# Patient Record
Sex: Female | Born: 1995 | Race: Black or African American | Hispanic: No | Marital: Single | Smoking: Former smoker
Health system: Southern US, Community
[De-identification: ages and names within clinical notes are randomized; demographics above are authoritative.]

## PROBLEM LIST (undated history)

## (undated) DIAGNOSIS — J45909 Unspecified asthma, uncomplicated: Secondary | ICD-10-CM

---
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
## 2016-03-16 ENCOUNTER — Emergency Department: Payer: Medicaid Other

## 2016-03-16 ENCOUNTER — Encounter: Payer: Self-pay | Admitting: Urgent Care

## 2016-03-16 DIAGNOSIS — R0789 Other chest pain: Secondary | ICD-10-CM | POA: Insufficient documentation

## 2016-03-16 DIAGNOSIS — J45909 Unspecified asthma, uncomplicated: Secondary | ICD-10-CM | POA: Insufficient documentation

## 2016-03-16 DIAGNOSIS — F17200 Nicotine dependence, unspecified, uncomplicated: Secondary | ICD-10-CM | POA: Insufficient documentation

## 2016-03-16 DIAGNOSIS — R0602 Shortness of breath: Secondary | ICD-10-CM | POA: Diagnosis present

## 2016-03-16 LAB — CBC
HCT: 33.1 % — ABNORMAL LOW (ref 35.0–47.0)
Hemoglobin: 10.3 g/dL — ABNORMAL LOW (ref 12.0–16.0)
MCH: 21.9 pg — ABNORMAL LOW (ref 26.0–34.0)
MCHC: 31.2 g/dL — ABNORMAL LOW (ref 32.0–36.0)
MCV: 70.1 fL — ABNORMAL LOW (ref 80.0–100.0)
Platelets: 408 10*3/uL (ref 150–440)
RBC: 4.73 MIL/uL (ref 3.80–5.20)
RDW: 19.4 % — ABNORMAL HIGH (ref 11.5–14.5)
WBC: 5.8 10*3/uL (ref 3.6–11.0)

## 2016-03-16 LAB — BASIC METABOLIC PANEL
Anion gap: 11 (ref 5–15)
BUN: 11 mg/dL (ref 6–20)
CO2: 22 mmol/L (ref 22–32)
Calcium: 9.6 mg/dL (ref 8.9–10.3)
Chloride: 106 mmol/L (ref 101–111)
Creatinine, Ser: 0.9 mg/dL (ref 0.44–1.00)
GFR calc Af Amer: >60 mL/min (ref >60)
GFR calc non Af Amer: >60 mL/min (ref >60)
Glucose, Bld: 90 mg/dL (ref 65–99)
Potassium: 3.5 mmol/L (ref 3.5–5.1)
Sodium: 139 mmol/L (ref 135–145)

## 2016-03-16 LAB — TROPONIN I: Troponin I: <0.03 ng/mL (ref <0.03)

## 2016-03-16 NOTE — ED Triage Notes (Signed)
Patient presents with c/o retrosternal chest pain and SOB for months; worse tonight x 30-45 minutes. Patient breathing halted breathing pattern. Reports that symptoms worsened while smoking tonight. States, "I normally just brush this off, but tonight it wont go away."

## 2016-03-17 ENCOUNTER — Emergency Department
Admission: EM | Admit: 2016-03-17 | Discharge: 2016-03-17 | Disposition: A | Discharge status: Home / Self Care | Payer: Medicaid Other | Attending: Emergency Medicine | Admitting: Emergency Medicine

## 2016-03-17 DIAGNOSIS — R079 Chest pain, unspecified: Secondary | ICD-10-CM

## 2016-03-17 HISTORY — DX: Unspecified asthma, uncomplicated: J45.909

## 2016-03-17 LAB — TROPONIN I

## 2016-03-17 LAB — FIBRIN DERIVATIVES D-DIMER (ARMC ONLY): FIBRIN DERIVATIVES D-DIMER (ARMC): 149 (ref 0–499)

## 2016-03-17 MED ORDER — HYDROCODONE-ACETAMINOPHEN 5-325 MG PO TABS
1.0000 | ORAL_TABLET | Freq: Once | ORAL | Status: AC
  Administered 2016-03-17: 1 via ORAL
  Filled 2016-03-17: qty 1

## 2016-03-17 MED ORDER — IBUPROFEN 600 MG PO TABS
600.0000 mg | ORAL_TABLET | Freq: Once | ORAL | Status: AC
  Administered 2016-03-17: 600 mg via ORAL
  Filled 2016-03-17: qty 1

## 2016-03-17 MED ORDER — HYDROCODONE-ACETAMINOPHEN 5-325 MG PO TABS: 1.0000 | ORAL_TABLET | Freq: Four times a day (QID) | ORAL | 0 refills | Status: DC | PRN

## 2016-03-17 MED ORDER — IBUPROFEN 600 MG PO TABS: 600.0000 mg | ORAL_TABLET | Freq: Three times a day (TID) | ORAL | 0 refills | Status: DC | PRN

## 2016-03-17 NOTE — ED Notes (Signed)
MD at bedside. 

## 2016-03-17 NOTE — ED Notes (Signed)
Discharge instructions reviewed with patient. Questions fielded by this RN. Patient verbalizes understanding of instructions. Patient discharged home in stable condition per Sung MD . No acute distress noted at time of discharge.   

## 2016-03-17 NOTE — ED Provider Notes (Signed)
[facility] Emergency Department Provider Note   ____________________________________________   First MD Initiated Contact with Patient 03/17/16 0235     (approximate)  I have reviewed the triage vital signs and the nursing notes.   HISTORY  Chief Complaint Chest Pain and Shortness of Breath    HPI Paula Terrell is a 20 y.o. female who presents to the ED from home with a chief complaint of chest pain. Patient reports a several month history of chest pain which she "normally just brushes off". Last evening while she was smoking, patient experienced midsternal chest pain associated with shortness of breath. Symptoms were not associated with diaphoresis, nausea, vomiting or palpitations. Patient denies recent fever, chills, cough, congestion, abdominal pain, diarrhea. Denies recent travel or trauma. Used to be on birth control but no longer takes. Nothing makes her symptoms better. Movement and deep breathing make her symptoms worse.   Past Medical History:  Diagnosis Date  . Asthma    exercise induced    There are no active problems to display for this patient.   History reviewed. No pertinent surgical history.  Prior to Admission medications   Medication Sig Start Date End Date Taking? Authorizing Provider  HYDROcodone-acetaminophen (NORCO) 5-325 MG tablet Take 1 tablet by mouth every 6 (six) hours as needed for moderate pain. 03/17/16   Irean Hong, MD  ibuprofen (ADVIL,MOTRIN) 600 MG tablet Take 1 tablet (600 mg total) by mouth every 8 (eight) hours as needed. 03/17/16   Irean Hong, MD    Allergies Review of patient's allergies indicates no known allergies.  Family history None for CAD  Social History Social History  Substance Use Topics  . Smoking status: Current Every Day Smoker  . Smokeless tobacco: Never Used  . Alcohol use No    Review of Systems  Constitutional: No fever/chills. Eyes: No visual changes. ENT: No sore  throat. Cardiovascular: Positive for chest pain. Respiratory: Positive for shortness of breath. Gastrointestinal: No abdominal pain.  No nausea, no vomiting.  No diarrhea.  No constipation. Genitourinary: Negative for dysuria. Musculoskeletal: Negative for back pain. Skin: Negative for rash. Neurological: Negative for headaches, focal weakness or numbness.  10-point ROS otherwise negative.  ____________________________________________   PHYSICAL EXAM:  VITAL SIGNS: ED Triage Vitals [03/16/16 2155]  Enc Vitals Group     BP 136/66     Pulse Rate 90     Resp 20     Temp 98.6 F (37 C)     Temp Source Oral     SpO2 99 %     Weight 160 lb (72.6 kg)     Height  (1.676 m)     Head Circumference      Peak Flow      Pain Score 7     Pain Loc      Pain Edu?      Excl. in GC?     Constitutional: Alert and oriented. Well appearing and in no acute distress. Eyes: Conjunctivae are normal. PERRL. EOMI. Head: Atraumatic. Nose: No congestion/rhinnorhea. Mouth/Throat: Mucous membranes are moist.  Oropharynx non-erythematous. Neck: No stridor.   Cardiovascular: Normal rate, regular rhythm. Grossly normal heart sounds.  Good peripheral circulation. Respiratory: Normal respiratory effort.  No retractions. Lungs CTAB. Anterior chest wall tender to palpation.  Gastrointestinal: Soft and nontender. No distention. No abdominal bruits. No CVA tenderness. Musculoskeletal: No lower extremity tenderness nor edema.  No joint effusions. Neurologic:  Normal speech and language. No gross focal neurologic  deficits are appreciated. No gait instability. Skin:  Skin is warm, dry and intact. No rash noted. Psychiatric: Mood and affect are normal. Speech and behavior are normal.  ____________________________________________   LABS (all labs ordered are listed, but only abnormal results are displayed)  Labs Reviewed  CBC - Abnormal; Notable for the following:       Result Value   Hemoglobin  10.3 (*)    HCT 33.1 (*)    MCV 70.1 (*)    MCH 21.9 (*)    MCHC 31.2 (*)    RDW 19.4 (*)    All other components within normal limits  BASIC METABOLIC PANEL  TROPONIN I  TROPONIN I  FIBRIN DERIVATIVES D-DIMER ([facility] ONLY)   ____________________________________________  EKG  ED ECG REPORT I, Donnetta Gillin J, the attending physician, personally viewed and interpreted this ECG.   Date: 03/17/2016  EKG Time: 2202  Rate: 90  Rhythm: normal EKG, normal sinus rhythm  Axis: Normal  Intervals:none  ST&T Change: Nonspecific  ____________________________________________  RADIOLOGY  Chest 2 view (viewed by me, interpreted per Dr. Karle Starch): No active cardiopulmonary disease. ____________________________________________   PROCEDURES  Procedure(s) performed: None  Procedures  Critical Care performed: No  ____________________________________________   INITIAL IMPRESSION / ASSESSMENT AND PLAN / ED COURSE  Pertinent labs & imaging results that were available during my care of the patient were reviewed by me and considered in my medical decision making (see chart for details).  20 year old female who presents with a several month history of chest pain associated with shortness of breath which was more pronounced last evening. Chest pain has almost subsided without intervention. Will repeat troponin, check d-dimer and reassess.  Clinical Course  Comment By Time  Updated patient on negative d-dimer and repeat troponin results. She is eager for discharge and voices no complaints of pain. Strict return precautions given. Patient verbalizes understanding and agrees with plan of care. Irean Hong, MD 07/28 [phone]     ____________________________________________   FINAL CLINICAL IMPRESSION(S) / ED DIAGNOSES  Final diagnoses:  Chest pain, unspecified chest pain type      NEW MEDICATIONS STARTED DURING THIS VISIT:  New Prescriptions   HYDROCODONE-ACETAMINOPHEN (NORCO) 5-325 MG  TABLET    Take 1 tablet by mouth every 6 (six) hours as needed for moderate pain.   IBUPROFEN (ADVIL,MOTRIN) 600 MG TABLET    Take 1 tablet (600 mg total) by mouth every 8 (eight) hours as needed.     Note:  This document was prepared using Dragon voice recognition software and may include unintentional dictation errors.    Irean Hong, MD 03/17/16 [phone]

## 2016-03-17 NOTE — Discharge Instructions (Signed)
1. You may take pain medicines as needed (Motrin/Norco #15). 2. Apply moist heat to chest wall several times daily. 3. Return to the ER for worsening symptoms, persistent vomiting, difficult to breathing or other concerns.

## 2016-03-26 ENCOUNTER — Encounter: Payer: Self-pay | Admitting: Medical Oncology

## 2016-03-26 ENCOUNTER — Emergency Department
Admission: EM | Admit: 2016-03-26 | Discharge: 2016-03-26 | Disposition: A | Discharge status: Home / Self Care | Payer: Medicaid Other | Attending: Emergency Medicine | Admitting: Emergency Medicine

## 2016-03-26 DIAGNOSIS — J45909 Unspecified asthma, uncomplicated: Secondary | ICD-10-CM | POA: Diagnosis not present

## 2016-03-26 DIAGNOSIS — F17200 Nicotine dependence, unspecified, uncomplicated: Secondary | ICD-10-CM | POA: Insufficient documentation

## 2016-03-26 DIAGNOSIS — J069 Acute upper respiratory infection, unspecified: Secondary | ICD-10-CM | POA: Insufficient documentation

## 2016-03-26 DIAGNOSIS — R0981 Nasal congestion: Secondary | ICD-10-CM | POA: Diagnosis present

## 2016-03-26 MED ORDER — BENZONATATE 100 MG PO CAPS: 100.0000 mg | ORAL_CAPSULE | Freq: Three times a day (TID) | ORAL | 0 refills | Status: DC | PRN

## 2016-03-26 MED ORDER — FEXOFENADINE-PSEUDOEPHED ER 60-120 MG PO TB12: 1.0000 | ORAL_TABLET | Freq: Two times a day (BID) | ORAL | 0 refills | Status: DC

## 2016-03-26 NOTE — ED Triage Notes (Signed)
Pt began yesterday having nasal congestion and cough.

## 2016-03-26 NOTE — ED Provider Notes (Signed)
[facility] Emergency Department Provider Note   ____________________________________________   First MD Initiated Contact with Patient 03/26/16 [phone]     (approximate)  I have reviewed the triage vital signs and the nursing notes.   HISTORY  Chief Complaint Nasal Congestion    HPI Paula Terrell is a 20 y.o. female patient complaining is a congestion cough which started yesterday. Patient denies any fever associated this complaint. Patient has some mild nausea but no vomiting or diarrhea. Patient also stated intermittent ear pressure but denies hearing loss or vertigo. Patient rates her pain discomfort as a 7/10. Patient described her pain discomfort as "pressure". No palliative measures taken for this complaint.   Past Medical History:  Diagnosis Date  . Asthma    exercise induced    There are no active problems to display for this patient.   History reviewed. No pertinent surgical history.  Prior to Admission medications   Medication Sig Start Date End Date Taking? Authorizing Provider  benzonatate (TESSALON PERLES) 100 MG capsule Take 1 capsule (100 mg total) by mouth 3 (three) times daily as needed for cough. 03/26/16   Joni Reining, PA-C  fexofenadine-pseudoephedrine (ALLEGRA-D) 60-120 MG 12 hr tablet Take 1 tablet by mouth 2 (two) times daily. 03/26/16   Joni Reining, PA-C  HYDROcodone-acetaminophen (NORCO) 5-325 MG tablet Take 1 tablet by mouth every 6 (six) hours as needed for moderate pain. 03/17/16   Irean Hong, MD  ibuprofen (ADVIL,MOTRIN) 600 MG tablet Take 1 tablet (600 mg total) by mouth every 8 (eight) hours as needed. 03/17/16   Irean Hong, MD    Allergies Review of patient's allergies indicates no known allergies.  No family history on file.  Social History Social History  Substance Use Topics  . Smoking status: Current Every Day Smoker  . Smokeless tobacco: Never Used  . Alcohol use No    Review of Systems Constitutional:  No fever/chills Eyes: No visual changes. ENT: No sore throat. Cardiovascular: Denies chest pain. Respiratory: Denies shortness of breath. Gastrointestinal: No abdominal pain.  No nausea, no vomiting.  No diarrhea.  No constipation. Genitourinary: Negative for dysuria. Musculoskeletal: Negative for back pain. Skin: Negative for rash. Neurological: Negative for headaches, focal weakness or numbness.    ____________________________________________   PHYSICAL EXAM:  VITAL SIGNS: ED Triage Vitals  Enc Vitals Group     BP 03/26/16 0917 125/86     Pulse Rate 03/26/16 0917 97     Resp 03/26/16 0917 16     Temp 03/26/16 0917 98 F (36.7 C)     Temp Source 03/26/16 0917 Oral     SpO2 03/26/16 0917 98 %     Weight 03/26/16 0912 160 lb (72.6 kg)     Height 03/26/16 0912  (1.676 m)     Head Circumference --      Peak Flow --      Pain Score 03/26/16 0913 7     Pain Loc --      Pain Edu? --      Excl. in GC? --     Constitutional: Alert and oriented. Well appearing and in no acute distress. Eyes: Conjunctivae are normal. PERRL. EOMI. Head: Atraumatic. Nose: Edematous nasal turbinates.. Mouth/Throat: Mucous membranes are moist.  Oropharynx non-erythematous. Postnasal drainage Neck: No stridor.  No cervical spine tenderness to palpation. Hematological/Lymphatic/Immunilogical: No cervical lymphadenopathy. Cardiovascular: Normal rate, regular rhythm. Grossly normal heart sounds.  Good peripheral circulation. Respiratory: Normal respiratory effort.  No retractions. Lungs CTAB. Gastrointestinal: Soft and nontender. No distention. No abdominal bruits. No CVA tenderness. Musculoskeletal: No lower extremity tenderness nor edema.  No joint effusions. Neurologic:  Normal speech and language. No gross focal neurologic deficits are appreciated. No gait instability. Skin:  Skin is warm, dry and intact. No rash noted. Psychiatric: Mood and affect are normal. Speech and behavior are  normal.  ____________________________________________   LABS (all labs ordered are listed, but only abnormal results are displayed)  Labs Reviewed - No data to display ____________________________________________  EKG   ____________________________________________  RADIOLOGY   ____________________________________________   PROCEDURES  Procedure(s) performed: None  Procedures  Critical Care performed: No  ____________________________________________   INITIAL IMPRESSION / ASSESSMENT AND PLAN / ED COURSE  Pertinent labs & imaging results that were available during my care of the patient were reviewed by me and considered in my medical decision making (see chart for details).  URI. Patient given discharge Instructions. Patient given prescription for fexofenadine/pseudoephedrine and Tessalon Perles. Patient advised follow-up with "clinic if condition persists.  Clinical Course     ____________________________________________   FINAL CLINICAL IMPRESSION(S) / ED DIAGNOSES  Final diagnoses:  Upper respiratory infection      NEW MEDICATIONS STARTED DURING THIS VISIT:  New Prescriptions   BENZONATATE (TESSALON PERLES) 100 MG CAPSULE    Take 1 capsule (100 mg total) by mouth 3 (three) times daily as needed for cough.   FEXOFENADINE-PSEUDOEPHEDRINE (ALLEGRA-D) 60-120 MG 12 HR TABLET    Take 1 tablet by mouth 2 (two) times daily.     Note:  This document was prepared using Dragon voice recognition software and may include unintentional dictation errors.    Joni Reiningonald K Sharlize Hoar, PA-C 03/26/16 [id number]    Jene Everyobert Kinner, MD 03/26/16 1001

## 2016-03-26 NOTE — ED Notes (Signed)
States she developed some nasal congestion yesterday  This morning has had occasional cough  No fever/chills

## 2016-04-13 ENCOUNTER — Emergency Department
Admission: EM | Admit: 2016-04-13 | Discharge: 2016-04-13 | Disposition: A | Discharge status: Home / Self Care | Payer: Medicaid Other | Attending: Emergency Medicine | Admitting: Emergency Medicine

## 2016-04-13 ENCOUNTER — Encounter: Payer: Self-pay | Admitting: Emergency Medicine

## 2016-04-13 DIAGNOSIS — N72 Inflammatory disease of cervix uteri: Secondary | ICD-10-CM | POA: Diagnosis not present

## 2016-04-13 DIAGNOSIS — J45909 Unspecified asthma, uncomplicated: Secondary | ICD-10-CM | POA: Insufficient documentation

## 2016-04-13 DIAGNOSIS — Z791 Long term (current) use of non-steroidal anti-inflammatories (NSAID): Secondary | ICD-10-CM | POA: Insufficient documentation

## 2016-04-13 DIAGNOSIS — F17200 Nicotine dependence, unspecified, uncomplicated: Secondary | ICD-10-CM | POA: Insufficient documentation

## 2016-04-13 DIAGNOSIS — N898 Other specified noninflammatory disorders of vagina: Secondary | ICD-10-CM | POA: Diagnosis present

## 2016-04-13 LAB — URINALYSIS COMPLETE WITH MICROSCOPIC (ARMC ONLY)
Bilirubin Urine: NEGATIVE
GLUCOSE, UA: NEGATIVE mg/dL
HGB URINE DIPSTICK: NEGATIVE
Ketones, ur: NEGATIVE mg/dL
NITRITE: NEGATIVE
Protein, ur: NEGATIVE mg/dL
SPECIFIC GRAVITY, URINE: 1.019 (ref 1.005–1.030)
pH: 5 (ref 5.0–8.0)

## 2016-04-13 LAB — CHLAMYDIA/NGC RT PCR (ARMC ONLY)
Chlamydia Tr: DETECTED — AB
N gonorrhoeae: DETECTED — AB

## 2016-04-13 LAB — WET PREP, GENITAL
CLUE CELLS WET PREP: NONE SEEN
SPERM: NONE SEEN
TRICH WET PREP: NONE SEEN
Yeast Wet Prep HPF POC: NONE SEEN

## 2016-04-13 LAB — POCT PREGNANCY, URINE: PREG TEST UR: NEGATIVE

## 2016-04-13 MED ORDER — AZITHROMYCIN 500 MG PO TABS
1000.0000 mg | ORAL_TABLET | Freq: Every day | ORAL | Status: DC
  Administered 2016-04-13: 1000 mg via ORAL
  Filled 2016-04-13: qty 2

## 2016-04-13 MED ORDER — CEFTRIAXONE SODIUM 1 G IJ SOLR
500.0000 mg | Freq: Once | INTRAMUSCULAR | Status: AC
  Administered 2016-04-13: 500 mg via INTRAMUSCULAR
  Filled 2016-04-13: qty 10

## 2016-04-13 MED ORDER — DOXYCYCLINE HYCLATE 100 MG PO CAPS: 100.0000 mg | ORAL_CAPSULE | Freq: Two times a day (BID) | ORAL | 0 refills | Status: DC

## 2016-04-13 MED ORDER — LIDOCAINE HCL (PF) 1 % IJ SOLN
2.1000 mL | Freq: Once | INTRAMUSCULAR | Status: AC
  Administered 2016-04-13: 2.1 mL
  Filled 2016-04-13: qty 5

## 2016-04-13 NOTE — ED Triage Notes (Addendum)
Pt from home with vaginal discharge x 1 week. States she also has "bumps" in her pubic region. She denies fever, chills. Pt confirms unprotected sex. States she has some burning on urination.

## 2016-04-13 NOTE — ED Notes (Signed)
See triage note. States she noticed some vaginal discharge and foul odor for the past 2 weeks  Also some vaginal itching..Paula Terrell

## 2016-04-13 NOTE — ED Notes (Signed)
NAD noted at time of D/C. Pt denies questions or concerns. Pt ambulatory to the lobby at this time.  

## 2016-04-13 NOTE — ED Provider Notes (Signed)
[facility] Emergency Department Provider Note  ____________________________________________  Time seen: Approximately 1:09 PM  I have reviewed the triage vital signs and the nursing notes.   HISTORY  Chief Complaint Vaginal Discharge    HPI Zaryiah Barz is a 20 y.o. female who presents to the emergency department for evaluation of vaginal discharge, bumps in the pubic region, and dysuria. Symptoms of the present for the past week. She denies fever or chills. She reports having had unprotected sex.  Past Medical History:  Diagnosis Date  . Asthma    exercise induced    There are no active problems to display for this patient.   History reviewed. No pertinent surgical history.  Prior to Admission medications   Medication Sig Start Date End Date Taking? Authorizing Provider  benzonatate (TESSALON PERLES) 100 MG capsule Take 1 capsule (100 mg total) by mouth 3 (three) times daily as needed for cough. 03/26/16   Joni Reining, PA-C  doxycycline (VIBRAMYCIN) 100 MG capsule Take 1 capsule (100 mg total) by mouth 2 (two) times daily. 04/13/16   Aneita Kiger B Charlyn Vialpando, FNP  fexofenadine-pseudoephedrine (ALLEGRA-D) 60-120 MG 12 hr tablet Take 1 tablet by mouth 2 (two) times daily. 03/26/16   Joni Reining, PA-C  HYDROcodone-acetaminophen (NORCO) 5-325 MG tablet Take 1 tablet by mouth every 6 (six) hours as needed for moderate pain. 03/17/16   Irean Hong, MD  ibuprofen (ADVIL,MOTRIN) 600 MG tablet Take 1 tablet (600 mg total) by mouth every 8 (eight) hours as needed. 03/17/16   Irean Hong, MD    Allergies Review of patient's allergies indicates no known allergies.  History reviewed. No pertinent family history.  Social History Social History  Substance Use Topics  . Smoking status: Current Every Day Smoker  . Smokeless tobacco: Never Used  . Alcohol use No    Review of Systems Constitutional: Negative for fever. Respiratory: Negative for shortness of breath or  cough. Gastrointestinal: Negative for abdominal pain; negative for nausea , negative for vomiting. Genitourinary: Positive for dysuria , positive for vaginal discharge. Musculoskeletal: Negative for back pain. Skin: Reports bumps in the pelvic area. ____________________________________________   PHYSICAL EXAM:  VITAL SIGNS: ED Triage Vitals [04/13/16 1243]  Enc Vitals Group     BP 132/81     Pulse Rate 85     Resp      Temp 98.8 F (37.1 C)     Temp Source Oral     SpO2 99 %     Weight 170 lb (77.1 kg)     Height 5\' 6"  (1.676 m)     Head Circumference      Peak Flow      Pain Score      Pain Loc      Pain Edu?      Excl. in GC?     Constitutional: Alert and oriented. Well appearing and in no acute distress. Eyes: Conjunctivae are normal. PERRL. EOMI. Head: Atraumatic. Nose: No congestion/rhinnorhea. Mouth/Throat: Mucous membranes are moist. Respiratory: Normal respiratory effort.  No retractions. Gastrointestinal: Soft, nontender, no guarding, no rebound. Genitourinary: Pelvic exam: No exudate noted on the walls of the vaginal vault. Discharge from the cervix noted at the os. Mild cervical motion tenderness on exam. Adnexa normal on palpation. Musculoskeletal: No extremity tenderness nor edema.  Neurologic:  Normal speech and language. No gross focal neurologic deficits are appreciated. Speech is normal. No gait instability. Skin:  Skin is warm, dry and intact. No rash noted.  Psychiatric: Mood and affect are normal. Speech and behavior are normal.  ____________________________________________   LABS (all labs ordered are listed, but only abnormal results are displayed)  Labs Reviewed  CHLAMYDIA/NGC RT PCR ([facility] ONLY) - Abnormal; Notable for the following:       Result Value   Chlamydia Tr DETECTED (*)    N gonorrhoeae DETECTED (*)    All other components within normal limits  WET PREP, GENITAL - Abnormal; Notable for the following:    WBC, Wet Prep HPF  POC FEW (*)    All other components within normal limits  URINALYSIS COMPLETEWITH MICROSCOPIC ([facility] ONLY) - Abnormal; Notable for the following:    Color, Urine YELLOW (*)    APPearance CLEAR (*)    Leukocytes, UA 1+ (*)    Bacteria, UA RARE (*)    Squamous Epithelial / LPF 0-5 (*)    All other components within normal limits  POC URINE PREG, ED  POCT PREGNANCY, URINE   ____________________________________________  RADIOLOGY  Not indicated ____________________________________________   PROCEDURES  Procedure(s) performed: Pelvic exam  ____________________________________________   INITIAL IMPRESSION / ASSESSMENT AND PLAN / ED COURSE  Pertinent labs & imaging results that were available during my care of the patient were reviewed by me and considered in my medical decision making (see chart for details).  IM Rocephin and azithromycin 1 g by mouth given while in the emergency department.  Patient will be given prescriptions for doxycycline today. She was advised to follow up with [facility] for symptoms that are not improving over the week. She was advised to return to the ER for symptoms that change or worsen if unable to schedule an appointment. ____________________________________________   FINAL CLINICAL IMPRESSION(S) / ED DIAGNOSES  Final diagnoses:  Cervicitis    Note:  This document was prepared using Dragon voice recognition software and may include unintentional dictation errors.    Chinita PesterCari B Marijah Larranaga, FNP 04/14/16 1709    Phineas SemenGraydon Goodman, MD 04/17/16 [phone]

## 2016-04-14 ENCOUNTER — Telehealth: Payer: Self-pay | Admitting: Emergency Medicine

## 2016-04-14 NOTE — Telephone Encounter (Signed)
Called patient and informed her of positive chlamydia and gonorrhea tests.  She was treated in the ED.  She says her partner was here as well and was treated.

## 2016-06-12 ENCOUNTER — Encounter: Payer: Self-pay | Admitting: Emergency Medicine

## 2016-06-12 ENCOUNTER — Emergency Department
Admission: EM | Admit: 2016-06-12 | Discharge: 2016-06-13 | Disposition: A | Discharge status: Home / Self Care | Payer: Medicaid Other | Attending: Emergency Medicine | Admitting: Emergency Medicine

## 2016-06-12 DIAGNOSIS — J45909 Unspecified asthma, uncomplicated: Secondary | ICD-10-CM | POA: Insufficient documentation

## 2016-06-12 DIAGNOSIS — R0789 Other chest pain: Secondary | ICD-10-CM | POA: Diagnosis not present

## 2016-06-12 DIAGNOSIS — F17200 Nicotine dependence, unspecified, uncomplicated: Secondary | ICD-10-CM | POA: Insufficient documentation

## 2016-06-12 DIAGNOSIS — Z79899 Other long term (current) drug therapy: Secondary | ICD-10-CM | POA: Diagnosis not present

## 2016-06-12 DIAGNOSIS — R079 Chest pain, unspecified: Secondary | ICD-10-CM

## 2016-06-12 NOTE — ED Notes (Signed)
Pt states at 22:30 CP and SOB began. No SOB  Noted, pt talking in complete sentences. No increase in breathing noted. Pt states she was leaving work, smoking a cigarette when symptoms began. Denies any weakness, N&V, back pain. Pain is constant, does not radiate.

## 2016-06-12 NOTE — ED Triage Notes (Addendum)
Pt in with co chest pain since 2230 states shob but no shob noted at this time. Hx of the same and was dx with panic attacks. States pain worse when she takes a deep breath.

## 2016-06-13 ENCOUNTER — Emergency Department: Payer: Medicaid Other

## 2016-06-13 LAB — TROPONIN I

## 2016-06-13 LAB — CBC WITH DIFFERENTIAL/PLATELET
BASOS ABS: 0.1 10*3/uL (ref 0–0.1)
BASOS PCT: 1 %
EOS ABS: 0.1 10*3/uL (ref 0–0.7)
EOS PCT: 1 %
HCT: 31.5 % — ABNORMAL LOW (ref 35.0–47.0)
Hemoglobin: 10.2 g/dL — ABNORMAL LOW (ref 12.0–16.0)
LYMPHS PCT: 39 %
Lymphs Abs: 2.6 10*3/uL (ref 1.0–3.6)
MCH: 23.8 pg — ABNORMAL LOW (ref 26.0–34.0)
MCHC: 32.3 g/dL (ref 32.0–36.0)
MCV: 73.5 fL — AB (ref 80.0–100.0)
MONO ABS: 0.4 10*3/uL (ref 0.2–0.9)
Monocytes Relative: 6 %
Neutro Abs: 3.6 10*3/uL (ref 1.4–6.5)
Neutrophils Relative %: 53 %
PLATELETS: 274 10*3/uL (ref 150–440)
RBC: 4.29 MIL/uL (ref 3.80–5.20)
RDW: 18.3 % — AB (ref 11.5–14.5)
WBC: 6.7 10*3/uL (ref 3.6–11.0)

## 2016-06-13 LAB — COMPREHENSIVE METABOLIC PANEL
ALT: 12 U/L — AB (ref 14–54)
AST: 18 U/L (ref 15–41)
Albumin: 3.9 g/dL (ref 3.5–5.0)
Alkaline Phosphatase: 36 U/L — ABNORMAL LOW (ref 38–126)
Anion gap: 7 (ref 5–15)
BUN: 14 mg/dL (ref 6–20)
CHLORIDE: 106 mmol/L (ref 101–111)
CO2: 24 mmol/L (ref 22–32)
CREATININE: 0.82 mg/dL (ref 0.44–1.00)
Calcium: 9.2 mg/dL (ref 8.9–10.3)
GFR calc Af Amer: >60 mL/min (ref >60)
Glucose, Bld: 84 mg/dL (ref 65–99)
POTASSIUM: 3.6 mmol/L (ref 3.5–5.1)
SODIUM: 137 mmol/L (ref 135–145)
Total Bilirubin: 0.4 mg/dL (ref 0.3–1.2)
Total Protein: 7.9 g/dL (ref 6.5–8.1)

## 2016-06-13 MED ORDER — KETOROLAC TROMETHAMINE 30 MG/ML IJ SOLN
10.0000 mg | Freq: Once | INTRAMUSCULAR | Status: AC
  Administered 2016-06-13: 9.9 mg via INTRAVENOUS
  Filled 2016-06-13: qty 1

## 2016-06-13 NOTE — ED Provider Notes (Signed)
[facility] Emergency Department Provider Note   ____________________________________________   First MD Initiated Contact with Patient 06/13/16 0034     (approximate)  I have reviewed the triage vital signs and the nursing notes.   HISTORY  Chief Complaint Chest Pain    HPI Paula Terrell is a 20 y.o. female who presents to the ED from work with a chief complaint of chest pain. Reports leaving work approximately 10:30 PM and had onset of sternal and right-sided sharp chest pain. States initially she was short of breath but that has resolved. Reports history of same and diagnosed with panic attacks. Reports she was not anxious at the time of pain onset. She was however, smoking a cigarette when her symptoms began. Denies recent fever, chills, cough, congestion, abdominal pain, nausea, vomiting, diarrhea, dysuria, diaphoresis. Denies recent travel or trauma. Denies OCP use. Nothing makes her symptoms better. Deep inspiration makes her pain worse.   Past Medical History:  Diagnosis Date  . Asthma    exercise induced    There are no active problems to display for this patient.   No past surgical history on file.  Prior to Admission medications   Medication Sig Start Date End Date Taking? Authorizing Provider  benzonatate (TESSALON PERLES) 100 MG capsule Take 1 capsule (100 mg total) by mouth 3 (three) times daily as needed for cough. 03/26/16   Joni Reiningonald K Smith, PA-C  doxycycline (VIBRAMYCIN) 100 MG capsule Take 1 capsule (100 mg total) by mouth 2 (two) times daily. 04/13/16   Cari B Triplett, FNP  fexofenadine-pseudoephedrine (ALLEGRA-D) 60-120 MG 12 hr tablet Take 1 tablet by mouth 2 (two) times daily. 03/26/16   Joni Reiningonald K Smith, PA-C  HYDROcodone-acetaminophen (NORCO) 5-325 MG tablet Take 1 tablet by mouth every 6 (six) hours as needed for moderate pain. 03/17/16   Irean HongJade J Sung, MD  ibuprofen (ADVIL,MOTRIN) 600 MG tablet Take 1 tablet (600 mg total) by mouth every 8  (eight) hours as needed. 03/17/16   Irean HongJade J Sung, MD    Allergies Review of patient's allergies indicates no known allergies.  No family history on file.  Social History Social History  Substance Use Topics  . Smoking status: Current Every Day Smoker  . Smokeless tobacco: Never Used  . Alcohol use No    Review of Systems  Constitutional: No fever/chills. Eyes: No visual changes. ENT: No sore throat. Cardiovascular: Positive for chest pain. Respiratory: Denies shortness of breath. Gastrointestinal: No abdominal pain.  No nausea, no vomiting.  No diarrhea.  No constipation. Genitourinary: Negative for dysuria. Musculoskeletal: Negative for back pain. Skin: Negative for rash. Neurological: Negative for headaches, focal weakness or numbness.  10-point ROS otherwise negative.  ____________________________________________   PHYSICAL EXAM:  VITAL SIGNS: ED Triage Vitals  Enc Vitals Group     BP 06/12/16 2310 (!) 146/86     Pulse Rate 06/12/16 2310 95     Resp 06/12/16 2310 18     Temp 06/12/16 2310 98.6 F (37 C)     Temp Source 06/12/16 2310 Oral     SpO2 06/12/16 2310 99 %     Weight 06/12/16 2310 170 lb (77.1 kg)     Height 06/12/16 2310 5\' 6"  (1.676 m)     Head Circumference --      Peak Flow --      Pain Score 06/12/16 2311 8     Pain Loc --      Pain Edu? --  Excl. in GC? --     Constitutional: Alert and oriented. Well appearing and in no acute distress. Eyes: Conjunctivae are normal. PERRL. EOMI. Head: Atraumatic. Nose: No congestion/rhinnorhea. Mouth/Throat: Mucous membranes are moist.  Oropharynx non-erythematous. Neck: No stridor.   Cardiovascular: Normal rate, regular rhythm. Grossly normal heart sounds.  Good peripheral circulation. Respiratory: Normal respiratory effort.  No retractions. Lungs CTAB. Gastrointestinal: Soft and nontender. No distention. No abdominal bruits. No CVA tenderness. Musculoskeletal: No lower extremity tenderness nor  edema.  No joint effusions.  No calf tenderness. Neurologic:  Normal speech and language. No gross focal neurologic deficits are appreciated. No gait instability. Skin:  Skin is warm, dry and intact. No rash noted. Psychiatric: Mood and affect are normal. Speech and behavior are normal.  ____________________________________________   LABS (all labs ordered are listed, but only abnormal results are displayed)  Labs Reviewed  CBC WITH DIFFERENTIAL/PLATELET - Abnormal; Notable for the following:       Result Value   Hemoglobin 10.2 (*)    HCT 31.5 (*)    MCV 73.5 (*)    MCH 23.8 (*)    RDW 18.3 (*)    All other components within normal limits  COMPREHENSIVE METABOLIC PANEL - Abnormal; Notable for the following:    ALT 12 (*)    Alkaline Phosphatase 36 (*)    All other components within normal limits  TROPONIN I   ____________________________________________  EKG  ED ECG REPORT I, SUNG,JADE J, the attending physician, personally viewed and interpreted this ECG.   Date: 06/13/2016  EKG Time: 2315  Rate: 96  Rhythm: normal EKG, normal sinus rhythm  Axis: Normal  Intervals:none  ST&T Change: Nonspecific  ____________________________________________  RADIOLOGY  Chest 2 view (viewed by me, interpreted per Dr. Gwenyth Bender): No active cardiopulmonary disease. ____________________________________________   PROCEDURES  Procedure(s) performed: None  Procedures  Critical Care performed: No  ____________________________________________   INITIAL IMPRESSION / ASSESSMENT AND PLAN / ED COURSE  Pertinent labs & imaging results that were available during my care of the patient were reviewed by me and considered in my medical decision making (see chart for details).  20 year old female who presents with chest pain. Low suspicion for ACS, PE, aortic dissection. Will obtain screening lab work including troponin and chest x-ray.  Clinical Course  Comment By Time  Patient  resting in no acute distress. Voices no complaints of chest pain. Updated her of laboratory imaging results. Strict return precautions given. Patient verbalizes understanding and agrees with plan of care. Irean Hong, MD 10/24 0300     ____________________________________________   FINAL CLINICAL IMPRESSION(S) / ED DIAGNOSES  Final diagnoses:  Nonspecific chest pain      NEW MEDICATIONS STARTED DURING THIS VISIT:  New Prescriptions   No medications on file     Note:  This document was prepared using Dragon voice recognition software and may include unintentional dictation errors.    Irean Hong, MD 06/13/16 [phone]

## 2016-06-13 NOTE — Discharge Instructions (Signed)
1. You may take Tylenol and/or ibuprofen as needed for discomfort. 2. Return to the ER for worsening symptoms, persistent vomiting, difficulty breathing or other concerns.

## 2016-06-13 NOTE — ED Notes (Signed)
Pt and visitor asleep in room. Warm blanket covering pt. Lights dimmed for comfort.

## 2016-06-13 NOTE — ED Notes (Signed)
[facility]

## 2016-06-13 NOTE — ED Notes (Signed)
Discharge instructions reviewed with patient. Questions fielded by this RN. Patient verbalizes understanding of instructions. Patient discharged home in stable condition per Sung MD . No acute distress noted at time of discharge.   

## 2016-06-15 ENCOUNTER — Encounter: Payer: Self-pay | Admitting: *Deleted

## 2016-06-15 ENCOUNTER — Ambulatory Visit: Payer: Medicaid Other | Admitting: Cardiology

## 2016-06-30 ENCOUNTER — Emergency Department (HOSPITAL_COMMUNITY): Payer: MEDICAID

## 2016-06-30 ENCOUNTER — Encounter (HOSPITAL_COMMUNITY): Payer: Self-pay | Admitting: Emergency Medicine

## 2016-06-30 ENCOUNTER — Emergency Department (HOSPITAL_COMMUNITY)
Admission: EM | Admit: 2016-06-30 | Discharge: 2016-07-01 | Disposition: A | Discharge status: Other Acute Inpatient Hospital | Payer: MEDICAID | Attending: Emergency Medicine | Admitting: Emergency Medicine

## 2016-06-30 DIAGNOSIS — F329 Major depressive disorder, single episode, unspecified: Secondary | ICD-10-CM | POA: Diagnosis not present

## 2016-06-30 DIAGNOSIS — F17210 Nicotine dependence, cigarettes, uncomplicated: Secondary | ICD-10-CM | POA: Diagnosis not present

## 2016-06-30 DIAGNOSIS — F332 Major depressive disorder, recurrent severe without psychotic features: Secondary | ICD-10-CM | POA: Diagnosis not present

## 2016-06-30 DIAGNOSIS — F32A Depression, unspecified: Secondary | ICD-10-CM

## 2016-06-30 DIAGNOSIS — Z79899 Other long term (current) drug therapy: Secondary | ICD-10-CM | POA: Diagnosis not present

## 2016-06-30 DIAGNOSIS — R461 Bizarre personal appearance: Secondary | ICD-10-CM | POA: Diagnosis present

## 2016-06-30 DIAGNOSIS — F17200 Nicotine dependence, unspecified, uncomplicated: Secondary | ICD-10-CM | POA: Diagnosis not present

## 2016-06-30 DIAGNOSIS — R45851 Suicidal ideations: Secondary | ICD-10-CM | POA: Diagnosis not present

## 2016-06-30 DIAGNOSIS — J45909 Unspecified asthma, uncomplicated: Secondary | ICD-10-CM | POA: Diagnosis not present

## 2016-06-30 LAB — CBC
HEMATOCRIT: 31.3 % — AB (ref 36.0–46.0)
HEMOGLOBIN: 9.8 g/dL — AB (ref 12.0–15.0)
MCH: 23.3 pg — ABNORMAL LOW (ref 26.0–34.0)
MCHC: 31.3 g/dL (ref 30.0–36.0)
MCV: 74.3 fL — AB (ref 78.0–100.0)
Platelets: 363 10*3/uL (ref 150–400)
RBC: 4.21 MIL/uL (ref 3.87–5.11)
RDW: 16.4 % — ABNORMAL HIGH (ref 11.5–15.5)
WBC: 8 10*3/uL (ref 4.0–10.5)

## 2016-06-30 LAB — COMPREHENSIVE METABOLIC PANEL
ALBUMIN: 4.1 g/dL (ref 3.5–5.0)
ALT: 13 U/L — ABNORMAL LOW (ref 14–54)
AST: 23 U/L (ref 15–41)
Alkaline Phosphatase: 38 U/L (ref 38–126)
Anion gap: 7 (ref 5–15)
BILIRUBIN TOTAL: 0.6 mg/dL (ref 0.3–1.2)
BUN: 13 mg/dL (ref 6–20)
CHLORIDE: 107 mmol/L (ref 101–111)
CO2: 24 mmol/L (ref 22–32)
Calcium: 9.3 mg/dL (ref 8.9–10.3)
Creatinine, Ser: 0.88 mg/dL (ref 0.44–1.00)
GFR calc Af Amer: >60 mL/min (ref >60)
GFR calc non Af Amer: >60 mL/min (ref >60)
GLUCOSE: 92 mg/dL (ref 65–99)
POTASSIUM: 3.7 mmol/L (ref 3.5–5.1)
SODIUM: 138 mmol/L (ref 135–145)
Total Protein: 7.9 g/dL (ref 6.5–8.1)

## 2016-06-30 LAB — SALICYLATE LEVEL: Salicylate Lvl: <7 mg/dL (ref 2.8–30.0)

## 2016-06-30 LAB — I-STAT BETA HCG BLOOD, ED (MC, WL, AP ONLY): I-stat hCG, quantitative: <5 m[IU]/mL (ref <5)

## 2016-06-30 LAB — ETHANOL: Alcohol, Ethyl (B): <5 mg/dL (ref <5)

## 2016-06-30 LAB — ACETAMINOPHEN LEVEL: Acetaminophen (Tylenol), Serum: <10 ug/mL — ABNORMAL LOW (ref 10–30)

## 2016-06-30 MED ORDER — IBUPROFEN 200 MG PO TABS
600.0000 mg | ORAL_TABLET | Freq: Four times a day (QID) | ORAL | Status: DC | PRN
  Administered 2016-06-30: 600 mg via ORAL
  Filled 2016-06-30: qty 3

## 2016-06-30 MED ORDER — NICOTINE 21 MG/24HR TD PT24: 21.0000 mg | MEDICATED_PATCH | Freq: Every day | TRANSDERMAL | Status: DC

## 2016-06-30 MED ORDER — ONDANSETRON HCL 4 MG PO TABS: 4.0000 mg | ORAL_TABLET | Freq: Three times a day (TID) | ORAL | Status: DC | PRN

## 2016-06-30 MED ORDER — ALUM & MAG HYDROXIDE-SIMETH 200-200-20 MG/5ML PO SUSP: 30.0000 mL | ORAL | Status: DC | PRN

## 2016-06-30 NOTE — ED Notes (Signed)
Pt transported to x-ray for hand; security went with them.

## 2016-06-30 NOTE — ED Provider Notes (Signed)
[facility]-EMERGENCY DEPT Provider Note   CSN: [id number] Arrival date & time: 06/30/16  1135     History   Chief Complaint Chief Complaint  Patient presents with  . Medical Clearance    HPI Paula Terrell is a 20 y.o. female.  20 year old female who is here under IVC due to bizarre behavior as well as suicidal ideations. Patient denies this at this time. She denies any current alcohol or drug use. She is very invasive upon questioning. According to police patient was initially combative. Patient states that she injured her right wrist earlier but is unsure of how this happened. Upon further questioning she is becoming more agitated and refuses to give any further information. She does deny spine to internal stimuli. She denies being suicidal at this time      Past Medical History:  Diagnosis Date  . Asthma    exercise induced    There are no active problems to display for this patient.   History reviewed. No pertinent surgical history.  OB History    No data available       Home Medications    Prior to Admission medications   Medication Sig Start Date End Date Taking? Authorizing Provider  benzonatate (TESSALON PERLES) 100 MG capsule Take 1 capsule (100 mg total) by mouth 3 (three) times daily as needed for cough. 03/26/16   Joni Reiningonald K Smith, PA-C  doxycycline (VIBRAMYCIN) 100 MG capsule Take 1 capsule (100 mg total) by mouth 2 (two) times daily. 04/13/16   Cari B Triplett, FNP  fexofenadine-pseudoephedrine (ALLEGRA-D) 60-120 MG 12 hr tablet Take 1 tablet by mouth 2 (two) times daily. 03/26/16   Joni Reiningonald K Smith, PA-C  HYDROcodone-acetaminophen (NORCO) 5-325 MG tablet Take 1 tablet by mouth every 6 (six) hours as needed for moderate pain. 03/17/16   Irean HongJade J Sung, MD  ibuprofen (ADVIL,MOTRIN) 600 MG tablet Take 1 tablet (600 mg total) by mouth every 8 (eight) hours as needed. 03/17/16   Irean HongJade J Sung, MD    Family History No family history on file.  Social History Social History    Substance Use Topics  . Smoking status: Current Every Day Smoker  . Smokeless tobacco: Never Used  . Alcohol use No     Allergies   Patient has no known allergies.   Review of Systems Review of Systems  Unable to perform ROS: Psychiatric disorder     Physical Exam Updated Vital Signs BP 121/76   Pulse 100   Temp 98.2 F (36.8 C)   Resp 16   LMP 05/13/2016 (LMP Unknown)   SpO2 100%   Physical Exam  Constitutional: She is oriented to person, place, and time. She appears well-developed and well-nourished.  Non-toxic appearance. No distress.  HENT:  Head: Normocephalic and atraumatic.  Eyes: Conjunctivae, EOM and lids are normal. Pupils are equal, round, and reactive to light.  Neck: Normal range of motion. Neck supple. No tracheal deviation present. No thyroid mass present.  Cardiovascular: Normal rate, regular rhythm and normal heart sounds.  Exam reveals no gallop.   No murmur heard. Pulmonary/Chest: Effort normal and breath sounds normal. No stridor. No respiratory distress. She has no decreased breath sounds. She has no wheezes. She has no rhonchi. She has no rales.  Abdominal: Soft. Normal appearance and bowel sounds are normal. She exhibits no distension. There is no tenderness. There is no rebound and no CVA tenderness.  Musculoskeletal: Normal range of motion. She exhibits no edema or tenderness.  Arms: Right wrist without swelling or deformity. Full range of motion. Radial pulse 2+. Neurovascular intact distally  Neurological: She is alert and oriented to person, place, and time. She has normal strength. No cranial nerve deficit or sensory deficit. GCS eye subscore is 4. GCS verbal subscore is 5. GCS motor subscore is 6.  Skin: Skin is warm and dry. No abrasion and no rash noted.  Psychiatric: Her affect is blunt. Her speech is delayed. She is withdrawn. She is inattentive.  Nursing note and vitals reviewed.    ED Treatments / Results  Labs (all labs  ordered are listed, but only abnormal results are displayed) Labs Reviewed  CBC - Abnormal; Notable for the following:       Result Value   Hemoglobin 9.8 (*)    HCT 31.3 (*)    MCV 74.3 (*)    MCH 23.3 (*)    RDW 16.4 (*)    All other components within normal limits  COMPREHENSIVE METABOLIC PANEL  ETHANOL  SALICYLATE LEVEL  ACETAMINOPHEN LEVEL  RAPID URINE DRUG SCREEN, HOSP PERFORMED  I-STAT BETA HCG BLOOD, ED ([facility], [facility], [facility] ONLY)  I-STAT BETA HCG BLOOD, ED ([facility], [facility], [facility] ONLY)    EKG  EKG Interpretation None       Radiology No results found.  Procedures Procedures (including critical care time)  Medications Ordered in ED Medications - No data to display   Initial Impression / Assessment and Plan / ED Course  I have reviewed the triage vital signs and the nursing notes.  Pertinent labs & imaging results that were available during my care of the patient were reviewed by me and considered in my medical decision making (see chart for details).  Pt medically cleared for psych dispo  Final Clinical Impressions(s) / ED Diagnoses   Final diagnoses:  None    New Prescriptions New Prescriptions   No medications on file     Lorre NickAnthony Rhyan Radler, MD 07/01/16 [phone]

## 2016-06-30 NOTE — [facility] Counselor (Signed)
Writer was going to do face to face assessment but pt now going to xray. Writer spoke to [employer] Allen re: pt's clinical info.   Evette Cristalaroline Paige Beila Purdie, [place] Therapeutic Triage Specialist

## 2016-06-30 NOTE — ED Notes (Signed)
Pt uncooperative by refusing to answer questions. Pt just looks at RN with flat affect. [employer] at bedside

## 2016-06-30 NOTE — ED Notes (Signed)
Affect blunted, mood depressed, guarded. Denies SI/HI/AVS. Unable to assess thought processes because of limited responses. Ate a sandwich and a lunch tray. In bed resting, calm and cooperative.
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
## 2016-06-30 NOTE — ED Notes (Signed)
SBAR Report received from previous nurse. Pt received calm and visible on unit. Pt denies current SI/ HI, A/V H, depression, anxiety, and pain at this time, and is otherwise stable. Pt reminded of camera surveillance, q 15 min rounds, and rules of the milieu. Will continue to assess. 

## 2016-06-30 NOTE — ED Notes (Signed)
Pt reminded UDS needed, and cup provided

## 2016-06-30 NOTE — ED Notes (Addendum)
Pt Belonging: Blue belly ring, two diamond rings, engagement ring, pandora bracelet. Blue shirt, gray sweat pants and black socks with strips, one is solid with color at the toe; white jacket. Blk bra

## 2016-06-30 NOTE — Progress Notes (Signed)
06/30/16 1339:  LRT went to pt room to offer activities, pt was sleep.  Caroll RancherMarjette Bela Nyborg, LRT/CTRS

## 2016-06-30 NOTE — ED Notes (Signed)
[facility] Expected date:  Expected time:  Means of arrival:  Comments: Hold for Resus B

## 2016-06-30 NOTE — ED Notes (Signed)
[place] Diane in [place] [facility]

## 2016-07-01 ENCOUNTER — Encounter (HOSPITAL_COMMUNITY): Payer: Self-pay

## 2016-07-01 ENCOUNTER — Encounter (HOSPITAL_COMMUNITY): Payer: Self-pay | Admitting: Registered Nurse

## 2016-07-01 ENCOUNTER — Inpatient Hospital Stay (HOSPITAL_COMMUNITY)
Admission: AD | Admit: 2016-07-01 | Discharge: 2016-07-04 | DRG: 885 | Disposition: A | Discharge status: Home / Self Care | Payer: MEDICAID | Attending: Psychiatry | Admitting: Psychiatry

## 2016-07-01 DIAGNOSIS — F329 Major depressive disorder, single episode, unspecified: Secondary | ICD-10-CM | POA: Diagnosis not present

## 2016-07-01 DIAGNOSIS — F4325 Adjustment disorder with mixed disturbance of emotions and conduct: Secondary | ICD-10-CM | POA: Diagnosis present

## 2016-07-01 DIAGNOSIS — F332 Major depressive disorder, recurrent severe without psychotic features: Principal | ICD-10-CM | POA: Diagnosis present

## 2016-07-01 DIAGNOSIS — Z79899 Other long term (current) drug therapy: Secondary | ICD-10-CM | POA: Diagnosis not present

## 2016-07-01 DIAGNOSIS — J45990 Exercise induced bronchospasm: Secondary | ICD-10-CM | POA: Diagnosis present

## 2016-07-01 DIAGNOSIS — Z811 Family history of alcohol abuse and dependence: Secondary | ICD-10-CM | POA: Diagnosis not present

## 2016-07-01 DIAGNOSIS — R454 Irritability and anger: Secondary | ICD-10-CM | POA: Diagnosis present

## 2016-07-01 DIAGNOSIS — R45851 Suicidal ideations: Secondary | ICD-10-CM

## 2016-07-01 DIAGNOSIS — D509 Iron deficiency anemia, unspecified: Secondary | ICD-10-CM | POA: Diagnosis present

## 2016-07-01 DIAGNOSIS — F17210 Nicotine dependence, cigarettes, uncomplicated: Secondary | ICD-10-CM | POA: Diagnosis present

## 2016-07-01 LAB — RAPID URINE DRUG SCREEN, HOSP PERFORMED
Amphetamines: NOT DETECTED
Barbiturates: NOT DETECTED
Benzodiazepines: NOT DETECTED
COCAINE: NOT DETECTED
OPIATES: NOT DETECTED
TETRAHYDROCANNABINOL: POSITIVE — AB

## 2016-07-01 MED ORDER — TRAZODONE HCL 50 MG PO TABS
50.0000 mg | ORAL_TABLET | Freq: Every evening | ORAL | Status: DC | PRN
  Administered 2016-07-01 – 2016-07-03 (×3): 50 mg via ORAL
  Filled 2016-07-01 (×3): qty 1

## 2016-07-01 MED ORDER — HYDROXYZINE HCL 25 MG PO TABS
25.0000 mg | ORAL_TABLET | Freq: Three times a day (TID) | ORAL | Status: DC | PRN
  Administered 2016-07-01 – 2016-07-03 (×3): 25 mg via ORAL
  Filled 2016-07-01 (×3): qty 1

## 2016-07-01 MED ORDER — FLUOXETINE HCL 20 MG PO CAPS
20.0000 mg | ORAL_CAPSULE | Freq: Every day | ORAL | Status: DC
  Administered 2016-07-01: 20 mg via ORAL
  Filled 2016-07-01: qty 1

## 2016-07-01 MED ORDER — MAGNESIUM HYDROXIDE 400 MG/5ML PO SUSP: 30.0000 mL | Freq: Every day | ORAL | Status: DC | PRN

## 2016-07-01 MED ORDER — ALUM & MAG HYDROXIDE-SIMETH 200-200-20 MG/5ML PO SUSP: 30.0000 mL | ORAL | Status: DC | PRN

## 2016-07-01 MED ORDER — FLUOXETINE HCL 20 MG PO CAPS
20.0000 mg | ORAL_CAPSULE | Freq: Every day | ORAL | Status: DC
  Administered 2016-07-02 – 2016-07-04 (×3): 20 mg via ORAL
  Filled 2016-07-01 (×5): qty 1

## 2016-07-01 MED ORDER — ACETAMINOPHEN 325 MG PO TABS: 650.0000 mg | ORAL_TABLET | Freq: Four times a day (QID) | ORAL | Status: DC | PRN

## 2016-07-01 NOTE — ED Notes (Signed)
This nurse in pt room for pt to sign consent to release information, pt identified Paula Terrell and Lindwood CokeNikki Lacy as people to release information in regards to pt care too.

## 2016-07-01 NOTE — BH Assessment (Signed)
[facility] Assessment Progress Note   [facility] Dr. Jama Flavorsobos. Programming eventually for 400 hall bed.

## 2016-07-01 NOTE — Progress Notes (Signed)
D   Pt is a 20 year old female admitted with depression   She was IVC by her mother   Pt said her mother left her when she was real little and they have not had a good relationship since    Pt said she got real emotional and said some things and did some things she shouldn't  But doesn't understand why she was brought here   She does admit to saying she was suicidal but also said she didn't mean it    Pt is anxious and a little irritable at the beginning of the assessment but became more relaxed and open toward the end    Nourishment given  Medications administered and effectiveness monitored     Admission assessment completed  Q 15 min checks   Pt is adjusting well and receptive to verbal support

## 2016-07-01 NOTE — ED Notes (Signed)
[facility], [employer] called for transport of pt. Pt currently with visitor.and information [facility] to visitor [facility]
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
## 2016-07-01 NOTE — ED Notes (Signed)
Informed by Richelle ItoLinsey, RN [facility] that [facility]; Shuvon NP notified.

## 2016-07-01 NOTE — BH Assessment (Addendum)
[facility] Assessment Progress Note This writer attempted to contact patient's sister Elyse Jarvisicole Radford [phone] per patient's request, to gather collateral information in reference to patient possibly being discharged this date. Sister was not able to be reached at that number. This Clinical research associatewriter also attempted to contact patient's mother Darrick GrinderMonique Hodder per writer's request, who took out IVC [phone] this date unsuccessfully.This Clinical research associatewriter will attempt again later this date.

## 2016-07-01 NOTE — ED Notes (Signed)
Pink phone returned to friend per pt request, belongings sheet signed.

## 2016-07-01 NOTE — ED Notes (Signed)
Pt presents with sad affect, tearful at times, argumentative, stating that she is ready to go home. Encouragement and support provided.  special checks q 15 mins in place for safety, Video monitoring in place.

## 2016-07-01 NOTE — Tx Team (Signed)
Initial Treatment Plan 07/01/2016 10:48 PM Drue DunMaya Curfman ZOX:[id number]    PATIENT STRESSORS: Marital or family conflict Medication change or noncompliance   PATIENT STRENGTHS: Average or above average intelligence Capable of independent living General fund of knowledge   PATIENT IDENTIFIED PROBLEMS: "I dont know why the police brought me here "  "I just said I was suicidal I dont really feel that way I just felt emotional                   DISCHARGE CRITERIA:  Ability to meet basic life and health needs Improved stabilization in mood, thinking, and/or behavior Verbal commitment to aftercare and medication compliance  PRELIMINARY DISCHARGE PLAN: Attend aftercare/continuing care group Return to previous work or school arrangements  PATIENT/FAMILY INVOLVEMENT: This treatment plan has been presented to and reviewed with the patient, Drue DunMaya Gheen, and/or family member, .  The patient and family have been given the opportunity to ask questions and make suggestions.  Andrena Mewsuttall, [place] Segler J, RN 07/01/2016, 10:48 PM

## 2016-07-02 DIAGNOSIS — R45851 Suicidal ideations: Secondary | ICD-10-CM

## 2016-07-02 MED ORDER — NICOTINE POLACRILEX 2 MG MT GUM
2.0000 mg | CHEWING_GUM | OROMUCOSAL | Status: DC | PRN
  Administered 2016-07-02 – 2016-07-03 (×2): 2 mg via ORAL
  Filled 2016-07-02: qty 1

## 2016-07-02 NOTE — [facility] Suicide Risk Assessment (Signed)
[facility]   Nursing information obtained from:    Demographic factors:    Current Mental Status:    Loss Factors:    Historical Factors:    Risk Reduction Factors:     Total Time spent with patient: 45 minutes Principal Problem: Severe recurrent major depression without psychotic features (HCC) Diagnosis:   Patient Active Problem List   Diagnosis Date Noted  . 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 Severe recurrent major depression without psychotic features [facility]) [F33.2] 07/01/2016   Subjective Data: patient is 20 year old female who was admitted under IVC taken out why her mother.  As per report special ran away from home 2 weeks ago.  Patient is a poor historian.  She minimizes all symptoms.  However she admitted that she felt abandoned by her family members.  She admitted that she wanted to kill herself but now she is fine.  She also endorse history of anger issues but again she told this morning everything is fine.  As per chart she also mentioned to her best friend goodbye and that she is going to kill herself.  Patient admitted lately she is feeling very isolated and withdrawn.  Patient did not elaborate details that led to hospitalization. Her UDS is positive for marijuana.  Continued Clinical Symptoms:  Alcohol Use Disorder Identification Test Final Score (AUDIT): 5 The "Alcohol Use Disorders Identification Test", Guidelines for Use in Primary Care, Second Edition.  World Science writerHealth Organization [facility](WHO). Score between 0-7:  no or low risk or alcohol related problems. Score between 8-15:  moderate risk of alcohol related problems. Score between 16-19:  high risk of alcohol related problems. Score 20 or above:  warrants further diagnostic evaluation for alcohol dependence and treatment.   CLINICAL FACTORS:   Depression:   Anhedonia Comorbid alcohol  abuse/dependence Hopelessness Impulsivity Alcohol/Substance Abuse/Dependencies Unstable or Poor Therapeutic Relationship   Musculoskeletal: Strength & Muscle Tone: within normal limits Gait & Station: normal Patient leans: N/A  Psychiatric Specialty Exam: Physical Exam  ROS  Blood pressure 112/60, pulse 81, temperature 97.7 F (36.5 C), temperature source Oral, resp. rate 18, height 5\' 6"  (1.676 m), weight 74.8 kg (165 lb), last menstrual period 05/13/2016.Body mass index is 26.63 kg/m.  General Appearance: Casual and Guarded  Eye Contact:  Fair  Speech:  Slow  Volume:  Decreased  Mood:  Depressed, Dysphoric, Hopeless and Irritable  Affect:  Constricted and Depressed  Thought Process:  Goal Directed  Orientation:  Full (Time, Place, and Person)  Thought Content:  Rumination  Suicidal Thoughts:  Yes.  with intent/plan  Homicidal Thoughts:  No  Memory:  Immediate;   Fair Recent;   Fair Remote;   Fair  Judgement:  Impaired  Insight:  Lacking  Psychomotor Activity:  Decreased  Concentration:  Concentration: Fair and Attention Span: Fair  Recall:  [employer]  Fund of Knowledge:  Fair  Language:  Fair  Akathisia:  No  Handed:  Right  AIMS (if indicated):     Assets:  Desire for Improvement  ADL's:  Intact  Cognition:  WNL  Sleep:  Number of Hours: 6      COGNITIVE FEATURES THAT CONTRIBUTE TO RISK:  Polarized thinking and Thought constriction (tunnel vision)    SUICIDE RISK:   Moderate:  Frequent suicidal ideation with limited intensity, and duration, some specificity in terms of plans, no associated intent, good self-control, limited dysphoria/symptomatology, some risk factors present, and identifiable protective factors, including  available and accessible social support.   PLAN OF CARE: patient is 20 year old female who was admitted due to having suicidal thoughts and plan to kill herself.  She denies any previous psychiatric history but endorsed that she's been feeling  very abandoned by her family member.  As per chart she tried to run into traffic which was stopped by police.  Patient requires inpatient treatment and stabilization.  Please see history and physical for more detail and plan.  I certify that inpatient services furnished can reasonably be expected to improve the patient's condition.  ARFEEN,SYED T., MD 07/02/2016, 11:00 AM

## 2016-07-02 NOTE — [facility] Group Notes (Signed)
[facility] Group Notes:  (Nursing/MHT/Case Management/Adjunct)  Date:  07/02/2016  Time:  5:28 PM   Type of Therapy:  Psychoeducational Skills  Participation Level:  Active  Participation Quality:  Appropriate  Affect:  Appropriate  Cognitive:  Appropriate  Insight:  Appropriate  Engagement in Group:  Engaged  Modes of Intervention:  Problem-solving  Summary of Progress/Problems:  Group encouraged to surround themselves with positive and healthy group/support system when changing to a healthy life style.    Bethann PunchesJane O Honestie Kulik 07/02/2016, 5:28 PM

## 2016-07-02 NOTE — [facility] Group Notes (Signed)
[facility] Group Notes:  (Nursing/MHT/Case Management/Adjunct)  Date:  07/02/2016  Time:  5:29 PM  Type of Therapy:  Psychoeducational Skills  Participation Level:  Active  Participation Quality:  Appropriate  Affect:  Appropriate  Cognitive:  Appropriate  Insight:  Appropriate  Engagement in Group:  Engaged  Modes of Intervention:  Problem-solving  Summary of Progress/Problems: Group encouraged to surround themselves with positive and healthy group/support system when changing to a healthy life style.      Bethann PunchesJane O Jaelyn Bourgoin 07/02/2016, 5:29 PM

## 2016-07-02 NOTE — [facility] Group Notes (Signed)
[facility] Group Notes: (Clinical Social Work)   07/02/2016      Type of Therapy:  Group Therapy   Participation Level:  Did Not Attend despite MHT prompting   Mariano Doshi Grossman-Orr, LCSW 07/02/2016, 11:20 AM     

## 2016-07-02 NOTE — Progress Notes (Signed)
[facility] Group Notes:  (Nursing/MHT/Case Management/Adjunct)  Date:  07/02/2016  Time:  2100 Type of Therapy:  wrap up group  Participation Level:  Active  Participation Quality:  Appropriate, Attentive, Sharing and Supportive  Affect:  Appropriate  Cognitive:  Appropriate  Insight:  Improving  Engagement in Group:  Engaged  Modes of Intervention:  Clarification, Education and Support  Summary of Progress/Problems: Pt reports feeling happier today than yesterday and is not sure why, "maybe medication".  Pt plans to relocate to [place] for a new start. Pt would like to have a better relationship with her family. Pt reports a sister and fiance as her supports.  Marcille BuffyMcNeil, [name] S 07/02/2016, 11:09 PM

## 2016-07-02 NOTE — [facility] Counselor (Signed)
Clinical Social Work Note  CSW met with patient at her request, and was asked to call her place of employment to explain that she is in the hospital, thus not at work.  A release was signed, and phone call was attempted 2 times, successful the 3rd.    Selmer Dominion, LCSW 07/02/2016, 4:59 PM

## 2016-07-02 NOTE — Progress Notes (Signed)
DAR NOTE: Patient presents with anxious affect and depressed mood. Pt has been observed in the dayroom area interacting with both peers and staff. Pt stated she had a good night sleep and feel better and ready for discharge. Denies pain, auditory and visual hallucinations.  Rates depression at 0, hopelessness at 0, and anxiety at 0.  Maintained on routine safety checks.  Medications given as prescribed.  Support and encouragement offered as needed.  Attended group and participated.  States goal for today is "smile."  Patient observed socializing with peers in the dayroom.  Offered no complaint.

## 2016-07-02 NOTE — Progress Notes (Signed)
D.  Pt pleasant on approach, denies complaints at this time other than some anxiety.  Pt was positive for evening wrap up group, interacting appropriately with peers on the unit.  Pt denies SI/HI/hallucinations at this time.  A.  Support and encouragement offered, medication given as ordered  R.  Pt remains safe on the unit, will continue to monitor.
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
## 2016-07-03 DIAGNOSIS — Z79899 Other long term (current) drug therapy: Secondary | ICD-10-CM

## 2016-07-03 DIAGNOSIS — F332 Major depressive disorder, recurrent severe without psychotic features: Principal | ICD-10-CM

## 2016-07-03 LAB — PREGNANCY, URINE: Preg Test, Ur: NEGATIVE

## 2016-07-03 MED ORDER — FERROUS SULFATE 325 (65 FE) MG PO TABS
325.0000 mg | ORAL_TABLET | Freq: Every day | ORAL | Status: DC
  Administered 2016-07-04: 325 mg via ORAL
  Filled 2016-07-03 (×3): qty 1
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
## 2016-07-03 NOTE — Plan of Care (Signed)
Problem: Medication: Goal: Compliance with prescribed medication regimen will improve Outcome: Adequate for Discharge Pt is compliant with all scheduled medications.  Problem: Activity: Goal: Interest or engagement in activities will improve Outcome: Progressing Pt has participated in some unit activities and appears to be engaging with peers somewhat. Goal: Sleeping patterns will improve Outcome: Progressing Pt reports sleeping "good."

## 2016-07-03 NOTE — Progress Notes (Signed)
Adult Psychoeducational Group Note  Date:  07/03/2016 Time:  10:17 PM  Group Topic/Focus:  Wrap-Up Group:   The focus of this group is to help patients review their daily goal of treatment and discuss progress on daily workbooks.   Participation Level:  Active  Participation Quality:  Appropriate  Affect:  Appropriate  Cognitive:  Alert  Insight: Appropriate  Engagement in Group:  Engaged  Modes of Intervention:  Discussion  Additional Comments: Patient states, "I had a good day". Patient states she was happy she got a chance to be more open with the doctor. Karenna Romanoff L Sheilyn Boehlke 07/03/2016, 10:17 PM

## 2016-07-03 NOTE — Progress Notes (Signed)
D: Paula Terrell has been pleasant and cooperative today. She has voiced no complaints or concerns when asked. She denied SI, HI, AVH, and pain. She rated her levels of depression, anxiety, and hopelessness all at zero. She reported good sleep, appetite, and concentration as well as normal energy level. She did not attend recreation therapy but did go to the gym. A: Meds given as ordered. Q15 safety checks maintained. Support/encouragement offered. R: Pt remains free from harm and continues with treatment. Will continue to monitor for needs/safety.

## 2016-07-03 NOTE — [facility] Suicide Risk Assessment (Signed)
[facility]   Principal Problem: Severe recurrent major depression without psychotic features [facility](HCC) Discharge Diagnoses:  Patient Active Problem List   Diagnosis Date Noted  . 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 Severe recurrent major depression without psychotic features (HCC) [F33.2] 07/01/2016    Total Time spent with patient: 15 minutes  Musculoskeletal: Strength & Muscle Tone: within normal limits Gait & Station: normal Patient leans: N/A  Psychiatric Specialty Exam: ROS  Blood pressure 114/72, pulse 100, temperature 98.3 F (36.8 C), temperature source Oral, resp. rate 16, height 5\' 6"  (1.676 m), weight 74.8 kg (165 lb), last menstrual period 05/13/2016.Body mass index is 26.63 kg/m.   General Appearance: Casual  Eye Contact::  Good  Speech:  Clear and Coherent409  Volume:  Normal  Mood:  Euthymic  Affect:  Congruent  Thought Process:  Coherent  Orientation:  Full (Time, Place, and Person)  Thought Content:  Negative  Suicidal Thoughts:  No  Homicidal Thoughts:  No  Memory:  Negative  Judgement:  Fair  Insight:  Fair  Psychomotor Activity:  Normal  Concentration:  Good  Recall:  Good  Fund of Knowledge:Good  Language: Good  Akathisia:  No  Handed:  Right  AIMS (if indicated):     Assets:  Resilience  Sleep:  Number of Hours: 5.75  Cognition: WNL  ADL's:  Intact    Mental Status Per Nursing Assessment::   On Admission:     Demographic Factors:  NA  Loss Factors: NA  Historical Factors: Impulsivity  Risk Reduction Factors:   Living with another person, especially a relative  Continued Clinical Symptoms:  Dysthymia  Cognitive Features That Contribute To Risk:  None    Suicide Risk:  Mild:  Suicidal ideation of limited frequency, intensity, duration, and specificity.  There are no identifiable plans, no associated intent, mild dysphoria and related  symptoms, good self-control (both objective and subjective assessment), few other risk factors, and identifiable protective factors, including available and accessible social support.    Plan Of Care/Follow-up recommendations:  Other:  Patient denies suicidal or homicidal ideation, plan or intent at time of discharge.  Acquanetta SitElizabeth Woods Oates, MD 07/03/2016, 3:06 PM

## 2016-07-03 NOTE — Tx Team (Signed)
Interdisciplinary Treatment and Diagnostic Plan Update  07/03/2016 Time of Session: 9:30AM Paula Terrell MRN: [id number]  Principal Diagnosis: Severe recurrent major depression without psychotic features [facility](HCC)  Secondary Diagnoses: Principal Problem:   Severe recurrent major depression without psychotic features (HCC)   Current Medications:  Current Facility-Administered Medications  Medication Dose Route Frequency Provider Last Rate Last Dose  . acetaminophen (TYLENOL) tablet 650 mg  650 mg Oral Q6H PRN Jackelyn PolingJason A Berry, NP      . alum & mag hydroxide-simeth (MAALOX/MYLANTA) 200-200-20 MG/5ML suspension 30 mL  30 mL Oral Q4H PRN Jackelyn PolingJason A Berry, NP      . FLUoxetine (PROZAC) capsule 20 mg  20 mg Oral Daily Jackelyn PolingJason A Berry, NP   20 mg at 07/03/16 0800  . hydrOXYzine (ATARAX/VISTARIL) tablet 25 mg  25 mg Oral TID PRN Jackelyn PolingJason A Berry, NP   25 mg at 07/02/16 2110  . magnesium hydroxide (MILK OF MAGNESIA) suspension 30 mL  30 mL Oral Daily PRN Jackelyn PolingJason A Berry, NP      . nicotine polacrilex (NICORETTE) gum 2 mg  2 mg Oral PRN Beau FannyJohn C Withrow, FNP   2 mg at 07/02/16 1256  . traZODone (DESYREL) tablet 50 mg  50 mg Oral QHS PRN Jackelyn PolingJason A Berry, NP   50 mg at 07/02/16 2110   PTA Medications: No prescriptions prior to admission.    Patient Stressors: Marital or family conflict Medication change or noncompliance  Patient Strengths: Average or above average intelligence Capable of independent living General fund of knowledge  Treatment Modalities: Medication Management, Group therapy, Case management,  1 to 1 session with clinician, Psychoeducation, Recreational therapy.   Physician Treatment Plan for Primary Diagnosis: Severe recurrent major depression without psychotic features (HCC) Long Term Goal(s): Improvement in symptoms so as ready for discharge Improvement in symptoms so as ready for discharge   Short Term Goals: Ability to identify changes in lifestyle to reduce recurrence of condition will  improve Ability to disclose and discuss suicidal ideas Ability to maintain clinical measurements within normal limits will improve Ability to identify triggers associated with substance abuse/mental health issues will improve Ability to demonstrate self-control will improve Compliance with prescribed medications will improve Ability to identify triggers associated with substance abuse/mental health issues will improve  Medication Management: Evaluate patient's response, side effects, and tolerance of medication regimen.  Therapeutic Interventions: 1 to 1 sessions, Unit Group sessions and Medication administration.  Evaluation of Outcomes: Progressing  Physician Treatment Plan for Secondary Diagnosis: Principal Problem:   Severe recurrent major depression without psychotic features (HCC)  Long Term Goal(s): Improvement in symptoms so as ready for discharge Improvement in symptoms so as ready for discharge   Short Term Goals: Ability to identify changes in lifestyle to reduce recurrence of condition will improve Ability to disclose and discuss suicidal ideas Ability to maintain clinical measurements within normal limits will improve Ability to identify triggers associated with substance abuse/mental health issues will improve Ability to demonstrate self-control will improve Compliance with prescribed medications will improve Ability to identify triggers associated with substance abuse/mental health issues will improve     Medication Management: Evaluate patient's response, side effects, and tolerance of medication regimen.  Therapeutic Interventions: 1 to 1 sessions, Unit Group sessions and Medication administration.  Evaluation of Outcomes: Progressing   RN Treatment Plan for Primary Diagnosis: Severe recurrent major depression without psychotic features (HCC) Long Term Goal(s): Knowledge of disease and therapeutic regimen to maintain health will improve  Short Term Goals:  Ability to remain free from injury will improve, Ability to disclose and discuss suicidal ideas and Ability to identify and develop effective coping behaviors will improve  Medication Management: RN will administer medications as ordered by provider, will assess and evaluate patient's response and provide education to patient for prescribed medication. RN will report any adverse and/or side effects to prescribing provider.  Therapeutic Interventions: 1 on 1 counseling sessions, Psychoeducation, Medication administration, Evaluate responses to treatment, Monitor vital signs and CBGs as ordered, Perform/monitor CIWA, COWS, AIMS and Fall Risk screenings as ordered, Perform wound care treatments as ordered.  Evaluation of Outcomes: Progressing   LCSW Treatment Plan for Primary Diagnosis: Severe recurrent major depression without psychotic features (HCC) Long Term Goal(s): Safe transition to appropriate next level of care at discharge, Engage patient in therapeutic group addressing interpersonal concerns.  Short Term Goals: Engage patient in aftercare planning with referrals and resources, Facilitate patient progression through stages of change regarding substance use diagnoses and concerns and Identify triggers associated with mental health/substance abuse issues  Therapeutic Interventions: Assess for all discharge needs, 1 to 1 time with Social worker, Explore available resources and support systems, Assess for adequacy in community support network, Educate family and significant other(s) on suicide prevention, Complete Psychosocial Assessment, Interpersonal group therapy.  Evaluation of Outcomes: Progressing   Progress in Treatment: Attending groups: Yes. Participating in groups: Yes. Taking medication as prescribed: Yes. Toleration medication: Yes. Family/Significant other contact made: No, will contact:  parent/family member if patient consents. Patient understands diagnosis:  Yes. Discussing patient identified problems/goals with staff: Yes. Medical problems stabilized or resolved: Yes. Denies suicidal/homicidal ideation: Yes. Self report.  Issues/concerns per patient self-inventory: No. Other: n/a  New problem(s) identified: No, Describe:  n/a  New Short Term/Long Term Goal(s): Medication stabilization; development of comprehensive and safe discharge plan.   Discharge Plan or Barriers: CSW assessing for appropriate referrals.   Reason for Continuation of Hospitalization: Depression Medication stabilization  Estimated Length of Stay: 1-3 days   Attendees: Patient: 07/03/2016 8:48 AM  Physician: Dr. Mckinley Jewelates MD 07/03/2016 8:48 AM  Nursing: Arvil ChacoKaren, Beverly, Caroline RN 07/03/2016 8:48 AM  RN Care Manager: Onnie BoerJennifer Clark CM 07/03/2016 8:48 AM  Social Worker: Trula SladeHeather Smart, LCSW 07/03/2016 8:48 AM  Recreational Therapist:  07/03/2016 8:48 AM  Other:  07/03/2016 8:48 AM  Other:  07/03/2016 8:48 AM  Other: 07/03/2016 8:48 AM    Scribe for Treatment Team: Ledell PeoplesHeather N Smart, LCSW 07/03/2016 8:48 AM

## 2016-07-03 NOTE — [facility] Suicide Risk Assessment (Signed)
B[facility] INPATIENT:  Family/Significant Other Suicide Prevention Education  Suicide Prevention Education:  Education Completed; Leandrew KoyanagiShakera McMillan (pt's sister) [phone] has been identified by the patient as the family member/significant other with whom the patient will be residing, and identified as the person(s) who will aid the patient in the event of a mental health crisis (suicidal ideations/suicide attempt).  With written consent from the patient, the family member/significant other has been provided the following suicide prevention education, prior to the and/or following the discharge of the patient.  The suicide prevention education provided includes the following:  Suicide risk factors  Suicide prevention and interventions  National Suicide Hotline telephone number  [facility] assessment telephone number  [facility] Emergency Assistance 911  [facility] and/or Residential Mobile Crisis Unit telephone number  Request made of family/significant other to:  Remove weapons (e.g., guns, rifles, knives), all items previously/currently identified as safety concern.    Remove drugs/medications (over-the-counter, prescriptions, illicit drugs), all items previously/currently identified as a safety concern.  The family member/significant other verbalizes understanding of the suicide prevention education information provided.  The family member/significant other agrees to remove the items of safety concern listed above.  Renton Berkley N Smart LCSW 07/03/2016, 3:42 PM

## 2016-07-03 NOTE — Progress Notes (Signed)
Recreation Therapy Notes  Date: 07/03/16 Time: 0930 Location: 300 Hall Dayroom  Group Topic: Stress Management  Goal Area(s) Addresses:  Patient will verbalize importance of using healthy stress management.  Patient will identify positive emotions associated with healthy stress management.   Intervention: Stress Management  Activity :  Peaceful Waves.  LRT introduced to the stress management technique of guided imagery.  LRT read a script to engage patients in the activity.  Patients were to follow along as LRT read script to participate in activity.  Education:  Stress Management, Discharge Planning.   Education Outcome: Acknowledges edcuation/In group clarification offered/Needs additional education  Clinical Observations/Feedback: Pt did not attend group.    Caroll RancherMarjette Ugo Thoma, LRT/CTRS         Caroll RancherLindsay, Fredrika Canby A 07/03/2016 12:22 PM
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
## 2016-07-03 NOTE — [facility] Suicide Risk Assessment (Signed)
[facility]   Nursing information obtained from:    Demographic factors:    Current Mental Status:    Loss Factors:    Historical Factors:    Risk Reduction Factors:     Total Time spent with patient: 15 minutes Principal Problem: Severe recurrent major depression without psychotic features (HCC) Diagnosis:   Patient Active Problem List   Diagnosis Date Noted  . 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 Severe recurrent major depression without psychotic features (HCC) [F33.2] 07/01/2016   Subjective Data: Patient denies any current suicidal or homicidal ideation, plan or intent.  Continued Clinical Symptoms:  Alcohol Use Disorder Identification Test Final Score (AUDIT): 5 The "Alcohol Use Disorders Identification Test", Guidelines for Use in Primary Care, Second Edition.  World Science writerHealth Organization [facility](WHO). Score between 0-7:  no or low risk or alcohol related problems. Score between 8-15:  moderate risk of alcohol related problems. Score between 16-19:  high risk of alcohol related problems. Score 20 or above:  warrants further diagnostic evaluation for alcohol dependence and treatment.   CLINICAL FACTORS:   Dysthymia   Musculoskeletal: Strength & Muscle Tone: within normal limits Gait & Station: normal Patient leans: N/A  Psychiatric Specialty Exam: Physical Exam  ROS  Blood pressure 114/72, pulse 100, temperature 98.3 F (36.8 C), temperature source Oral, resp. rate 16, height 5\' 6"  (1.676 m), weight 74.8 kg (165 lb), last menstrual period 05/13/2016.Body mass index is 26.63 kg/m.  General Appearance: Casual  Eye Contact:  Good  Speech:  Clear and Coherent  Volume:  Normal  Mood:  Euthymic  Affect:  Congruent  Thought Process:  Coherent  Orientation:  Full (Time, Place, and Person)  Thought Content:  Negative  Suicidal Thoughts:  No  Homicidal Thoughts:  No  Memory:  Negative   Judgement:  Impaired  Insight:  Present  Psychomotor Activity:  Normal  Concentration:  Concentration: Good and Attention Span: Good  Recall:  Good  Fund of Knowledge:  Good  Language:  Good  Akathisia:  No  Handed:  Right  AIMS (if indicated):     Assets:  Resilience  ADL's:  Intact  Cognition:  WNL  Sleep:  Number of Hours: 6.25      COGNITIVE FEATURES THAT CONTRIBUTE TO RISK:  None    SUICIDE RISK:   Mild:  Suicidal ideation of limited frequency, intensity, duration, and specificity.  There are no identifiable plans, no associated intent, mild dysphoria and related symptoms, good self-control (both objective and subjective assessment), few other risk factors, and identifiable protective factors, including available and accessible social support.   PLAN OF CARE: see PAA  I certify that inpatient services furnished can reasonably be expected to improve the patient's condition.  Acquanetta SitElizabeth Woods Emanii Bugbee, MD 07/03/2016, 2:18 PM

## 2016-07-04 MED ORDER — HYDROXYZINE HCL 25 MG PO TABS: 25.0000 mg | ORAL_TABLET | Freq: Three times a day (TID) | ORAL | 0 refills | Status: AC | PRN

## 2016-07-04 MED ORDER — NICOTINE POLACRILEX 2 MG MT GUM: 2.0000 mg | CHEWING_GUM | OROMUCOSAL | 0 refills | Status: AC | PRN

## 2016-07-04 MED ORDER — FERROUS SULFATE 325 (65 FE) MG PO TABS: 325.0000 mg | ORAL_TABLET | Freq: Every day | ORAL | 0 refills | Status: AC

## 2016-07-04 MED ORDER — TRAZODONE HCL 50 MG PO TABS: 50.0000 mg | ORAL_TABLET | Freq: Every evening | ORAL | 0 refills | Status: AC | PRN

## 2016-07-04 MED ORDER — FLUOXETINE HCL 20 MG PO CAPS: 20.0000 mg | ORAL_CAPSULE | Freq: Every day | ORAL | 0 refills | Status: AC

## 2016-07-04 NOTE — [facility] Group Notes (Signed)
The focus of this group is to educate the patient on the purpose and policies of crisis stabilization and provide a format to answer questions about their admission.  The group details unit policies and expectations of patients while admitted.  Patient did not attend 0900 nurse education orientation group this morning.  Patient stayed in bed.   
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
## 2016-07-04 NOTE — Progress Notes (Signed)
Pt discharged from unit after AVS, transition report, and suicide risk assessment was reviewed. Pt confirmed information with teach back. Patient was offered support and encouragement. Pts questions were answered and was escorted safely to transportation.

## 2016-07-04 NOTE — Progress Notes (Signed)
  [facility] Discharge [facility] :  Will you be returning to the same living situation after discharge:  Yes,  home At discharge, do you have transportation home?: Yes,  sister coming after lunch Do you have the ability to pay for your medications: Yes,  Medicaid  Release of information consent forms completed and submitted to medical records by CSW.  Patient to Follow up at: Follow-up Information    [facility] Follow up.   Why:  Walk in between 8am-11am within 48 hours of discharge from the hospital for assessment of services (medication management, counseling, group therapy-if offered). Please bring Medicaid card and ID with you to first visit. Thank you.  Contact information: [address] [place] [address] [phone]           Next level of care provider [facility] Link:no  Safety Planning and Suicide Prevention discussed: Yes,  SPE completed with pt's sister. SPI pamphlet provided to pt and Mobile Crisis information provided.  Have you used any form of tobacco in the last 30 days? (Cigarettes, Smokeless Tobacco, Cigars, and/or Pipes): Yes  Has patient been referred to the Quitline?: Patient refused referral  Patient has been referred for addiction treatment: Yes  Lailie Smead N Smart LCSW 07/04/2016, 9:22 AM

## 2016-07-04 NOTE — Progress Notes (Signed)
Nursing Progress Note: 7a-7p D: Pt currently presents with an appropriate affect and pleasant behavior. Pt states "I feel just fine today." Pt reports good sleep with current medication regimen.   A: Pt provided with medications per providers orders. Pt's labs and vitals were monitored throughout the day. Pt supported emotionally and encouraged to express concerns and questions. Pt educated on medications.  R: Pt's safety ensured with 15 minute and environmental checks. Pt currently denies SI/HI/Self Harm and A/V hallucinations. Pt verbally agrees to seek staff if SI/HI or A/VH occurs and to consult with staff before acting on any harmful thoughts. Will continue POC.

## 2016-07-04 NOTE — Tx Team (Signed)
Interdisciplinary Treatment and Diagnostic Plan Update  07/04/2016 Time of Session: 9:30AM Paula Terrell MRN: [id number]  Principal Diagnosis: Severe recurrent major depression without psychotic features [facility])  Secondary Diagnoses: Principal Problem:   Severe recurrent major depression without psychotic features ([place])   Current Medications:  Current Facility-Administered Medications  Medication Dose Route Frequency Provider Last Rate Last Dose  . acetaminophen (TYLENOL) tablet 650 mg  650 mg Oral Q6H PRN Rozetta Nunnery, NP      . alum & mag hydroxide-simeth (MAALOX/MYLANTA) 200-200-20 MG/5ML suspension 30 mL  30 mL Oral Q4H PRN Rozetta Nunnery, NP      . ferrous sulfate tablet 325 mg  325 mg Oral Q breakfast Linard Millers, MD   325 mg at 07/04/16 0809  . FLUoxetine (PROZAC) capsule 20 mg  20 mg Oral Daily Rozetta Nunnery, NP   20 mg at 07/04/16 0809  . hydrOXYzine (ATARAX/VISTARIL) tablet 25 mg  25 mg Oral TID PRN Rozetta Nunnery, NP   25 mg at 07/03/16 2123  . magnesium hydroxide (MILK OF MAGNESIA) suspension 30 mL  30 mL Oral Daily PRN Rozetta Nunnery, NP      . nicotine polacrilex (NICORETTE) gum 2 mg  2 mg Oral PRN Benjamine Mola, FNP   2 mg at 07/03/16 2035  . traZODone (DESYREL) tablet 50 mg  50 mg Oral QHS PRN Rozetta Nunnery, NP   50 mg at 07/03/16 2123   PTA Medications: No prescriptions prior to admission.    Patient Stressors: Marital or family conflict Medication change or noncompliance  Patient Strengths: Average or above average intelligence Capable of independent living General fund of knowledge  Treatment Modalities: Medication Management, Group therapy, Case management,  1 to 1 session with clinician, Psychoeducation, Recreational therapy.   Physician Treatment Plan for Primary Diagnosis: Severe recurrent major depression without psychotic features ([place]) Long Term Goal(s): Improvement in symptoms so as ready for discharge Improvement in symptoms so as ready for  discharge   Short Term Goals: Ability to identify changes in lifestyle to reduce recurrence of condition will improve Ability to disclose and discuss suicidal ideas Ability to maintain clinical measurements within normal limits will improve Ability to identify triggers associated with substance abuse/mental health issues will improve Ability to demonstrate self-control will improve Compliance with prescribed medications will improve Ability to identify triggers associated with substance abuse/mental health issues will improve  Medication Management: Evaluate patient's response, side effects, and tolerance of medication regimen.  Therapeutic Interventions: 1 to 1 sessions, Unit Group sessions and Medication administration.  Evaluation of Outcomes: Progressing  Physician Treatment Plan for Secondary Diagnosis: Principal Problem:   Severe recurrent major depression without psychotic features ([place])  Long Term Goal(s): Improvement in symptoms so as ready for discharge Improvement in symptoms so as ready for discharge   Short Term Goals: Ability to identify changes in lifestyle to reduce recurrence of condition will improve Ability to disclose and discuss suicidal ideas Ability to maintain clinical measurements within normal limits will improve Ability to identify triggers associated with substance abuse/mental health issues will improve Ability to demonstrate self-control will improve Compliance with prescribed medications will improve Ability to identify triggers associated with substance abuse/mental health issues will improve     Medication Management: Evaluate patient's response, side effects, and tolerance of medication regimen.  Therapeutic Interventions: 1 to 1 sessions, Unit Group sessions and Medication administration.  Evaluation of Outcomes: Met   RN Treatment Plan for Primary Diagnosis: Severe recurrent major  depression without psychotic features ([place]) Long Term Goal(s):  Knowledge of disease and therapeutic regimen to maintain health will improve  Short Term Goals: Ability to remain free from injury will improve, Ability to disclose and discuss suicidal ideas and Ability to identify and develop effective coping behaviors will improve  Medication Management: RN will administer medications as ordered by provider, will assess and evaluate patient's response and provide education to patient for prescribed medication. RN will report any adverse and/or side effects to prescribing provider.  Therapeutic Interventions: 1 on 1 counseling sessions, Psychoeducation, Medication administration, Evaluate responses to treatment, Monitor vital signs and CBGs as ordered, Perform/monitor CIWA, COWS, AIMS and Fall Risk screenings as ordered, Perform wound care treatments as ordered.  Evaluation of Outcomes: Progressing   LCSW Treatment Plan for Primary Diagnosis: Severe recurrent major depression without psychotic features ([place]) Long Term Goal(s): Safe transition to appropriate next level of care at discharge, Engage patient in therapeutic group addressing interpersonal concerns.  Short Term Goals: Engage patient in aftercare planning with referrals and resources, Facilitate patient progression through stages of change regarding substance use diagnoses and concerns and Identify triggers associated with mental health/substance abuse issues  Therapeutic Interventions: Assess for all discharge needs, 1 to 1 time with Social worker, Explore available resources and support systems, Assess for adequacy in community support network, Educate family and significant other(s) on suicide prevention, Complete Psychosocial Assessment, Interpersonal group therapy.  Evaluation of Outcomes: Met   Progress in Treatment: Attending groups: Yes. Participating in groups: Yes. Taking medication as prescribed: Yes. Toleration medication: Yes. Family/Significant other contact made: SPE completed with  pt's sister.  Patient understands diagnosis: Yes. Discussing patient identified problems/goals with staff: Yes. Medical problems stabilized or resolved: Yes. Denies suicidal/homicidal ideation: Yes. Self report.  Issues/concerns per patient self-inventory: No. Other: n/a  New problem(s) identified: No, Describe:  n/a  New Short Term/Long Term Goal(s): Medication stabilization; development of comprehensive and safe discharge plan.   Discharge Plan or Barriers: Pt plans to discharge home to her sister's house and will follow-up at [facility]   Reason for Continuation of Hospitalization: none  Estimated Length of Stay: d/c today   Attendees: Patient: 07/04/2016 9:24 AM  Physician: Dr. Sharolyn Douglas MD 07/04/2016 9:24 AM  Nursing: Geraldo Docker RN 07/04/2016 9:24 AM  RN Care Manager: Lars Pinks CM 07/04/2016 9:24 AM  Social Worker: Press photographer, LCSW 07/04/2016 9:24 AM  Recreational Therapist:  07/04/2016 9:24 AM  Other:  07/04/2016 9:24 AM  Other:  07/04/2016 9:24 AM  Other: 07/04/2016 9:24 AM    Scribe for Treatment Team: Wall, LCSW 07/04/2016 9:24 AM

## 2016-07-04 NOTE — Plan of Care (Signed)
Problem: Coping: Goal: Ability to cope will improve Outcome: Progressing Pt does not endorse depressive Sx this evening

## 2016-08-06 ENCOUNTER — Encounter (HOSPITAL_COMMUNITY): Payer: Self-pay | Admitting: Emergency Medicine

## 2016-08-06 ENCOUNTER — Emergency Department (HOSPITAL_COMMUNITY)
Admission: EM | Admit: 2016-08-06 | Discharge: 2016-08-06 | Disposition: A | Discharge status: Home / Self Care | Payer: Medicaid Other | Attending: Emergency Medicine | Admitting: Emergency Medicine

## 2016-08-06 DIAGNOSIS — J45909 Unspecified asthma, uncomplicated: Secondary | ICD-10-CM | POA: Diagnosis not present

## 2016-08-06 DIAGNOSIS — Z720 Tobacco use: Secondary | ICD-10-CM

## 2016-08-06 DIAGNOSIS — J069 Acute upper respiratory infection, unspecified: Secondary | ICD-10-CM | POA: Insufficient documentation

## 2016-08-06 DIAGNOSIS — B9789 Other viral agents as the cause of diseases classified elsewhere: Secondary | ICD-10-CM

## 2016-08-06 DIAGNOSIS — Z79899 Other long term (current) drug therapy: Secondary | ICD-10-CM | POA: Insufficient documentation

## 2016-08-06 DIAGNOSIS — F17200 Nicotine dependence, unspecified, uncomplicated: Secondary | ICD-10-CM | POA: Diagnosis not present

## 2016-08-06 NOTE — ED Triage Notes (Signed)
Pt sts URI sx with cough x 2 days

## 2016-08-06 NOTE — ED Provider Notes (Signed)
[facility]-EMERGENCY DEPT Provider Note   CSN: [id number] Arrival date & time: 08/06/16  1047  By signing my name below, I, Javier Docker, attest that this documentation has been prepared under the direction and in the presence of Alonza Knisley Camprubi-Soms, PA-C. Electronically Signed: Javier Docker, ER Scribe. 04/01/2016. 11:20 AM.   History   Chief Complaint Chief Complaint  Patient presents with  . URI   The history is provided by the patient and medical records.  URI   This is a new problem. The current episode started 2 days ago. The problem has not changed since onset.There has been no fever. Associated symptoms include congestion and cough. Pertinent negatives include no chest pain, no abdominal pain, no diarrhea, no nausea, no vomiting, no dysuria, no ear pain, no rhinorrhea, no sore throat, no rash and no wheezing. She has tried other medications for the symptoms. The treatment provided mild relief.    HPI Comments: Paula Terrell is a 20 y.o. female with a PMHx of asthma, who presents to the Emergency Department complaining of two days of URI symptoms including: sinus congestion, cough with yellow sputum production, and generalized body aches/myalgias. She has taken theraflu, ibuprofen and tylenol with limited relief. She denies exacerbating factors. No known sick contacts. Admits to being a cigarette smoker. No recent travel. She denies fevers, chills, CP, SOB, wheezing, ear pain/drainage, rhinorrhea, sore throat, abd pain, N/V/D/C, hematuria, dysuria, arthralgias, numbness, tingling, weakness, rashes, or any other complaints/symptoms.    Past Medical History:  Diagnosis Date  . Asthma    exercise induced    Patient Active Problem List   Diagnosis Date Noted  . Major depressive disorder, recurrent, severe without psychotic features (HCC) 07/01/2016  . Severe recurrent major depression without psychotic features (HCC) 07/01/2016    History reviewed. No pertinent surgical  history.  OB History    No data available       Home Medications    Prior to Admission medications   Medication Sig Start Date End Date Taking? Authorizing Provider  ferrous sulfate 325 (65 FE) MG tablet Take 1 tablet (325 mg total) by mouth daily with breakfast. 07/05/16   Beau Fanny, FNP  FLUoxetine (PROZAC) 20 MG capsule Take 1 capsule (20 mg total) by mouth daily. 07/05/16   Beau Fanny, FNP  hydrOXYzine (ATARAX/VISTARIL) 25 MG tablet Take 1 tablet (25 mg total) by mouth 3 (three) times daily as needed for anxiety. 07/04/16   Beau Fanny, FNP  nicotine polacrilex (NICORETTE) 2 MG gum Take 1 each (2 mg total) by mouth as needed for smoking cessation. 07/04/16   Beau Fanny, FNP  traZODone (DESYREL) 50 MG tablet Take 1 tablet (50 mg total) by mouth at bedtime as needed for sleep. 07/04/16   Beau Fanny, FNP    Family History History reviewed. No pertinent family history.  Social History Social History  Substance Use Topics  . Smoking status: Current Every Day Smoker  . Smokeless tobacco: Never Used  . Alcohol use Yes     Allergies   Patient has no known allergies.   Review of Systems Review of Systems  Constitutional: Negative for chills and fever.  HENT: Positive for congestion and sinus pressure. Negative for ear discharge, ear pain, rhinorrhea and sore throat.   Respiratory: Positive for cough. Negative for shortness of breath and wheezing.   Cardiovascular: Negative for chest pain.  Gastrointestinal: Negative for abdominal pain, constipation, diarrhea, nausea and vomiting.  Genitourinary: Negative for dysuria  and hematuria.  Musculoskeletal: Positive for myalgias (body aches). Negative for arthralgias.  Skin: Negative for rash.  Allergic/Immunologic: Negative for immunocompromised state.  Neurological: Negative for weakness and numbness.  Psychiatric/Behavioral: Negative for confusion.   A complete 10 system review of systems was obtained and all  systems are negative except as noted in the HPI and PMH.    Physical Exam Updated Vital Signs BP 111/74 (BP Location: Right Arm)   Pulse 82   Temp 98.5 F (36.9 C) (Oral)   Resp 18   SpO2 100%   Physical Exam  Constitutional: She is oriented to person, place, and time. Vital signs are normal. She appears well-developed and well-nourished.  Non-toxic appearance. No distress.  Afebrile, nontoxic, NAD  HENT:  Head: Normocephalic and atraumatic.  Right Ear: Hearing, tympanic membrane, external ear and ear canal normal.  Left Ear: Hearing, tympanic membrane, external ear and ear canal normal.  Nose: Mucosal edema and rhinorrhea present.  Mouth/Throat: Uvula is midline, oropharynx is clear and moist and mucous membranes are normal. No trismus in the jaw. No uvula swelling. Tonsils are 0 on the right. Tonsils are 0 on the left. No tonsillar exudate.  Ears are clear bilaterally. Nose with mild mucosal edema and clear rhinorrhea. Oropharynx clear and moist, without uvular swelling or deviation, no trismus or drooling, no tonsillar swelling or erythema, no exudates.   Eyes: Conjunctivae and EOM are normal. Right eye exhibits no discharge. Left eye exhibits no discharge.  Neck: Normal range of motion. Neck supple.  Cardiovascular: Normal rate, regular rhythm, normal heart sounds and intact distal pulses.  Exam reveals no gallop and no friction rub.   No murmur heard. Pulmonary/Chest: Effort normal and breath sounds normal. No respiratory distress. She has no decreased breath sounds. She has no wheezes. She has no rhonchi. She has no rales.  CTAB in all lung fields, no w/r/r, no hypoxia or increased WOB, speaking in full sentences, SpO2 100% on RA  Abdominal: Soft. Normal appearance and bowel sounds are normal. She exhibits no distension. There is no tenderness. There is no rigidity, no rebound, no guarding, no CVA tenderness, no tenderness at McBurney's point and negative Murphy's sign.    Musculoskeletal: Normal range of motion.  Neurological: She is alert and oriented to person, place, and time. She has normal strength. No sensory deficit.  Skin: Skin is warm, dry and intact. No rash noted.  Psychiatric: She has a normal mood and affect.  Nursing note and vitals reviewed.    ED Treatments / Results  Labs (all labs ordered are listed, but only abnormal results are displayed) Labs Reviewed - No data to display  EKG  EKG Interpretation None       Radiology No results found.  Procedures Procedures (including critical care time)  Medications Ordered in ED Medications - No data to display   Initial Impression / Assessment and Plan / ED Course  I have reviewed the triage vital signs and the nursing notes.  Pertinent labs & imaging results that were available during my care of the patient were reviewed by me and considered in my medical decision making (see chart for details).  Clinical Course     20 y.o. female here with 2 days of URI symptoms and cough/body aches. Pt is afebrile with a clear lung exam. Mild rhinorrhea. Likely viral URI. Pt is agreeable to symptomatic treatment with close follow up with PCP as needed but spoke at length about emergent changing or worsening of symptoms  that should prompt return to ER. Pt voices understanding and is agreeable to plan. Stable at time of discharge. Smoking cessation advised.  I personally performed the services described in this documentation, which was scribed in my presence. The recorded information has been reviewed and is accurate.    Final Clinical Impressions(s) / ED Diagnoses   Final diagnoses:  Viral URI with cough  Tobacco user    New Prescriptions New Prescriptions   No medications on file     Allen DerryMercedes Camprubi-Soms, PA-C 08/06/16 1129    Margarita Grizzleanielle Ray, MD 08/07/16 1621

## 2016-08-06 NOTE — ED Notes (Signed)
Declined W/C at D/C and was escorted to lobby by RN. 

## 2016-08-06 NOTE — Discharge Instructions (Signed)
Continue to stay well-hydrated. Gargle warm salt water and spit it out. Use chloraseptic spray as needed for sore throat. Continue to alternate between Tylenol and Ibuprofen for pain or fever. Use Mucinex for cough suppression/expectoration of mucus. Use netipot and flonase to help with nasal congestion. May consider over-the-counter Benadryl or other antihistamine to decrease secretions and for help with your symptoms. STOP SMOKING! Follow up with your primary care doctor in 5-7 days for recheck of ongoing symptoms. Return to emergency department for emergent changing or worsening of symptoms. °

## 2016-11-02 ENCOUNTER — Encounter (HOSPITAL_COMMUNITY): Payer: Self-pay | Admitting: Emergency Medicine

## 2016-11-02 DIAGNOSIS — Z79899 Other long term (current) drug therapy: Secondary | ICD-10-CM | POA: Diagnosis not present

## 2016-11-02 DIAGNOSIS — R51 Headache: Secondary | ICD-10-CM | POA: Insufficient documentation

## 2016-11-02 DIAGNOSIS — J45909 Unspecified asthma, uncomplicated: Secondary | ICD-10-CM | POA: Diagnosis not present

## 2016-11-02 DIAGNOSIS — Z87891 Personal history of nicotine dependence: Secondary | ICD-10-CM | POA: Diagnosis not present

## 2016-11-02 DIAGNOSIS — Z5321 Procedure and treatment not carried out due to patient leaving prior to being seen by health care provider: Secondary | ICD-10-CM | POA: Diagnosis not present

## 2016-11-02 NOTE — ED Triage Notes (Signed)
Pt having 8/10 sinus pain since yesterday, pt very congested a this time, no fever or chills.

## 2016-11-03 ENCOUNTER — Emergency Department (HOSPITAL_COMMUNITY)
Admission: EM | Admit: 2016-11-03 | Discharge: 2016-11-03 | Disposition: A | Discharge status: Home / Self Care | Payer: Medicaid Other | Attending: Emergency Medicine | Admitting: Emergency Medicine

## 2016-11-03 NOTE — ED Triage Notes (Signed)
The staff in the back has called the pt x 3 no answer

## 2016-12-24 ENCOUNTER — Encounter (HOSPITAL_COMMUNITY): Payer: Self-pay | Admitting: Emergency Medicine

## 2016-12-24 ENCOUNTER — Emergency Department (HOSPITAL_COMMUNITY): Payer: Self-pay

## 2016-12-24 ENCOUNTER — Emergency Department (HOSPITAL_COMMUNITY)
Admission: EM | Admit: 2016-12-24 | Discharge: 2016-12-24 | Disposition: A | Discharge status: Home / Self Care | Payer: Self-pay | Attending: Emergency Medicine | Admitting: Emergency Medicine

## 2016-12-24 DIAGNOSIS — Y999 Unspecified external cause status: Secondary | ICD-10-CM | POA: Insufficient documentation

## 2016-12-24 DIAGNOSIS — Y92007 Garden or yard of unspecified non-institutional (private) residence as the place of occurrence of the external cause: Secondary | ICD-10-CM | POA: Insufficient documentation

## 2016-12-24 DIAGNOSIS — W010XXA Fall on same level from slipping, tripping and stumbling without subsequent striking against object, initial encounter: Secondary | ICD-10-CM | POA: Insufficient documentation

## 2016-12-24 DIAGNOSIS — M25531 Pain in right wrist: Secondary | ICD-10-CM | POA: Insufficient documentation

## 2016-12-24 DIAGNOSIS — Z87891 Personal history of nicotine dependence: Secondary | ICD-10-CM | POA: Insufficient documentation

## 2016-12-24 DIAGNOSIS — Y93H2 Activity, gardening and landscaping: Secondary | ICD-10-CM | POA: Insufficient documentation

## 2016-12-24 DIAGNOSIS — J45909 Unspecified asthma, uncomplicated: Secondary | ICD-10-CM | POA: Insufficient documentation

## 2016-12-24 NOTE — ED Triage Notes (Signed)
Was doing yard work and fell yesterday hurting rt wrist denies hitting her head, wrist  Hurts when she rotates it , pain radiates up arm

## 2016-12-24 NOTE — Discharge Instructions (Addendum)
Please keep your splint on.  Please take ibuprofen or other over the counter pain medications as needed.

## 2016-12-24 NOTE — ED Provider Notes (Signed)
[facility] Provider Note   CSN: [id number] Arrival date & time: 12/24/16  0907   By signing my name below, I, Paula Terrell, attest that this documentation has been prepared under the direction and in the presence of Paula Terrell, [place]. Electronically Signed: Thelma BargeNick Terrell, Scribe. 12/24/16. 10:23 AM.   History   Chief Complaint Chief Complaint  Patient presents with  . Fall  . Wrist Injury    The history is provided by the patient. No language interpreter was used.  HPI Comments: Paula Terrell is a 21 y.o. female who presents to the Emergency Department complaining of gradually worsening, throbbing, 5/10 right wrist pain after she tripped over a cinder block and landed on her arm yesterday, FOOSH. She states the pain is worse when she moves it. Her pain improved when she slept with her hand elevated. She denies taking tylenol and ibuprofen for the pain. She denies numbness/tingling.  Denies striking head, denies LOC. Reports that this is an isolated injury    Past Medical History:  Diagnosis Date  . Asthma    exercise induced    Patient Active Problem List   Diagnosis Date Noted  . Major depressive disorder, recurrent, severe without psychotic features (HCC) 07/01/2016  . Severe recurrent major depression without psychotic features (HCC) 07/01/2016    History reviewed. No pertinent surgical history.  OB History    No data available       Home Medications    Prior to Admission medications   Medication Sig Start Date End Date Taking? Authorizing Provider  ferrous sulfate 325 (65 FE) MG tablet Take 1 tablet (325 mg total) by mouth daily with breakfast. 07/05/16   Terrell, Paula AllJohn C, FNP  FLUoxetine (PROZAC) 20 MG capsule Take 1 capsule (20 mg total) by mouth daily. 07/05/16   Terrell, Paula AllJohn C, FNP  hydrOXYzine (ATARAX/VISTARIL) 25 MG tablet Take 1 tablet (25 mg total) by mouth 3 (three) times daily as needed for anxiety. 07/04/16   Terrell, Paula AllJohn C, FNP  nicotine  polacrilex (NICORETTE) 2 MG gum Take 1 each (2 mg total) by mouth as needed for smoking cessation. 07/04/16   Terrell, Paula AllJohn C, FNP  traZODone (DESYREL) 50 MG tablet Take 1 tablet (50 mg total) by mouth at bedtime as needed for sleep. 07/04/16   Terrell, Paula AllJohn C, FNP    Family History No family history on file.  Social History Social History  Substance Use Topics  . Smoking status: Former Smoker    Types: Cigarettes  . Smokeless tobacco: Never Used  . Alcohol use Yes     Allergies   Patient has no known allergies.   Review of Systems Review of Systems  Musculoskeletal: Positive for arthralgias and myalgias. Negative for neck pain.  Neurological: Negative for numbness and headaches.     Physical Exam Updated Vital Signs BP 107/75 (BP Location: Right Arm)   Pulse 89   Temp 98.6 F (37 Terrell) (Oral)   Resp 18   Wt 72.6 kg   SpO2 100%   BMI 25.82 kg/m   Physical Exam  Constitutional: She is oriented to person, place, and time. She appears well-developed and well-nourished. No distress.  HENT:  Head: Normocephalic and atraumatic.  Cardiovascular: Normal rate.   Pulmonary/Chest: Effort normal.  Abdominal: She exhibits no distension.  Musculoskeletal: She exhibits tenderness.  Pain with palpation to anatomic snuff box. Forearm is not tender to palpation.  No pain with passive movement to wrist.  Hand is neurovascularly intact.   Neurological:  She is alert and oriented to person, place, and time.  Skin: Skin is warm and dry.  Psychiatric: She has a normal mood and affect.  Nursing note and vitals reviewed.    ED Treatments / Results  DIAGNOSTIC STUDIES: Oxygen Saturation is 100% on RA, normal by my interpretation.    COORDINATION OF CARE: 10:22 AM Discussed treatment plan with pt at bedside and pt agreed to plan. Labs (all labs ordered are listed, but only abnormal results are displayed) Labs Reviewed - No data to display  EKG  EKG Interpretation None        Radiology Dg Forearm Right  Result Date: 12/24/2016 CLINICAL DATA:  Fall. EXAM: RIGHT FOREARM - 2 VIEW COMPARISON:  None FINDINGS: There is no evidence of fracture or other focal bone lesions. Soft tissues are unremarkable. IMPRESSION: Negative. Electronically Signed   By: Paula Terrell M.D.   On: 12/24/2016 09:50   Dg Wrist Complete Right  Result Date: 12/24/2016 CLINICAL DATA:  Fall. EXAM: RIGHT WRIST - COMPLETE 3+ VIEW COMPARISON:  06/30/2016 FINDINGS: There is no evidence of fracture or dislocation. There is no evidence of arthropathy or other focal bone abnormality. Soft tissues are unremarkable. IMPRESSION: Negative. Electronically Signed   By: Paula Terrell M.D.   On: 12/24/2016 09:55    Procedures Procedures (including critical care time)  Medications Ordered in ED Medications - No data to display   Initial Impression / Assessment and Plan / ED Course  I have reviewed the triage vital signs and the nursing notes.  Pertinent labs & imaging results that were available during my care of the patient were reviewed by me and considered in my medical decision making (see chart for details).     Shontel Minardi presents after a FOOSH mechanism fall yesterday causing isolated injury to her right wrist.  X-rays were negative for fracture/dislocation.  Exam positive for tenderness to anatomic snuff box.  Patient given a thumb spica splint and follow up with hand surgery in one week to evaluate for potential scaphoid fracture.   Final Clinical Impressions(s) / ED Diagnoses   Final diagnoses:  Right wrist pain    New Prescriptions Discharge Medication List as of 12/24/2016 10:27 AM    I personally performed the services described in this documentation, which was scribed in my presence. The recorded information has been reviewed and is accurate.       Paula Gong, PA-Terrell 12/24/16 1050    Paula Kaplan, MD 12/25/16 [phone]

## 2016-12-24 NOTE — Progress Notes (Signed)
Orthopedic Tech Progress Note Patient Details:  Paula Terrell 02/11/1996 [id number]  Ortho Devices Type of Ortho Device: Thumb velcro splint Ortho Device/Splint Interventions: Application   Paula Terrell 12/24/2016, 10:36 AM

## 2018-03-21 IMAGING — DX DG FOREARM 2V*R*
2 series · 3 of 3 positions shown · non-contrast
Comparison: None

CLINICAL DATA: Fall.

EXAM:
RIGHT FOREARM - 2 VIEW

[forearm ap]
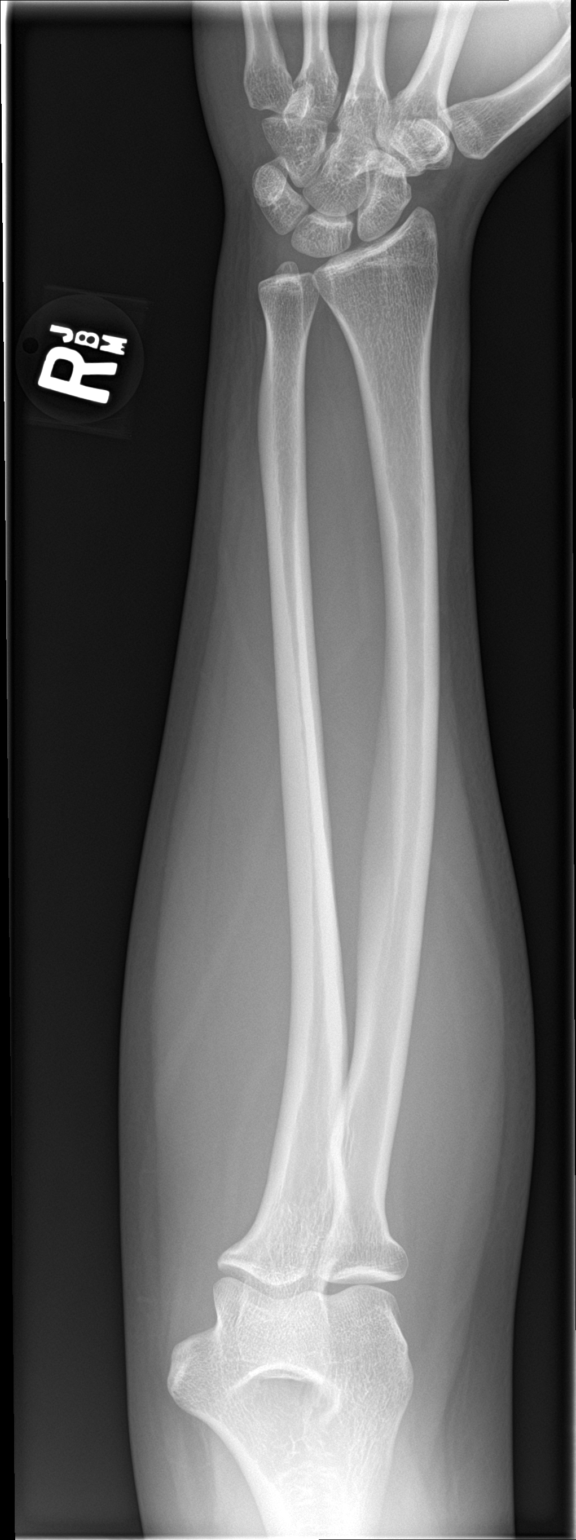

[Series 2: forearm lat · 0.14mm/px · 2 of 2 slices shown]
[im 1/2]
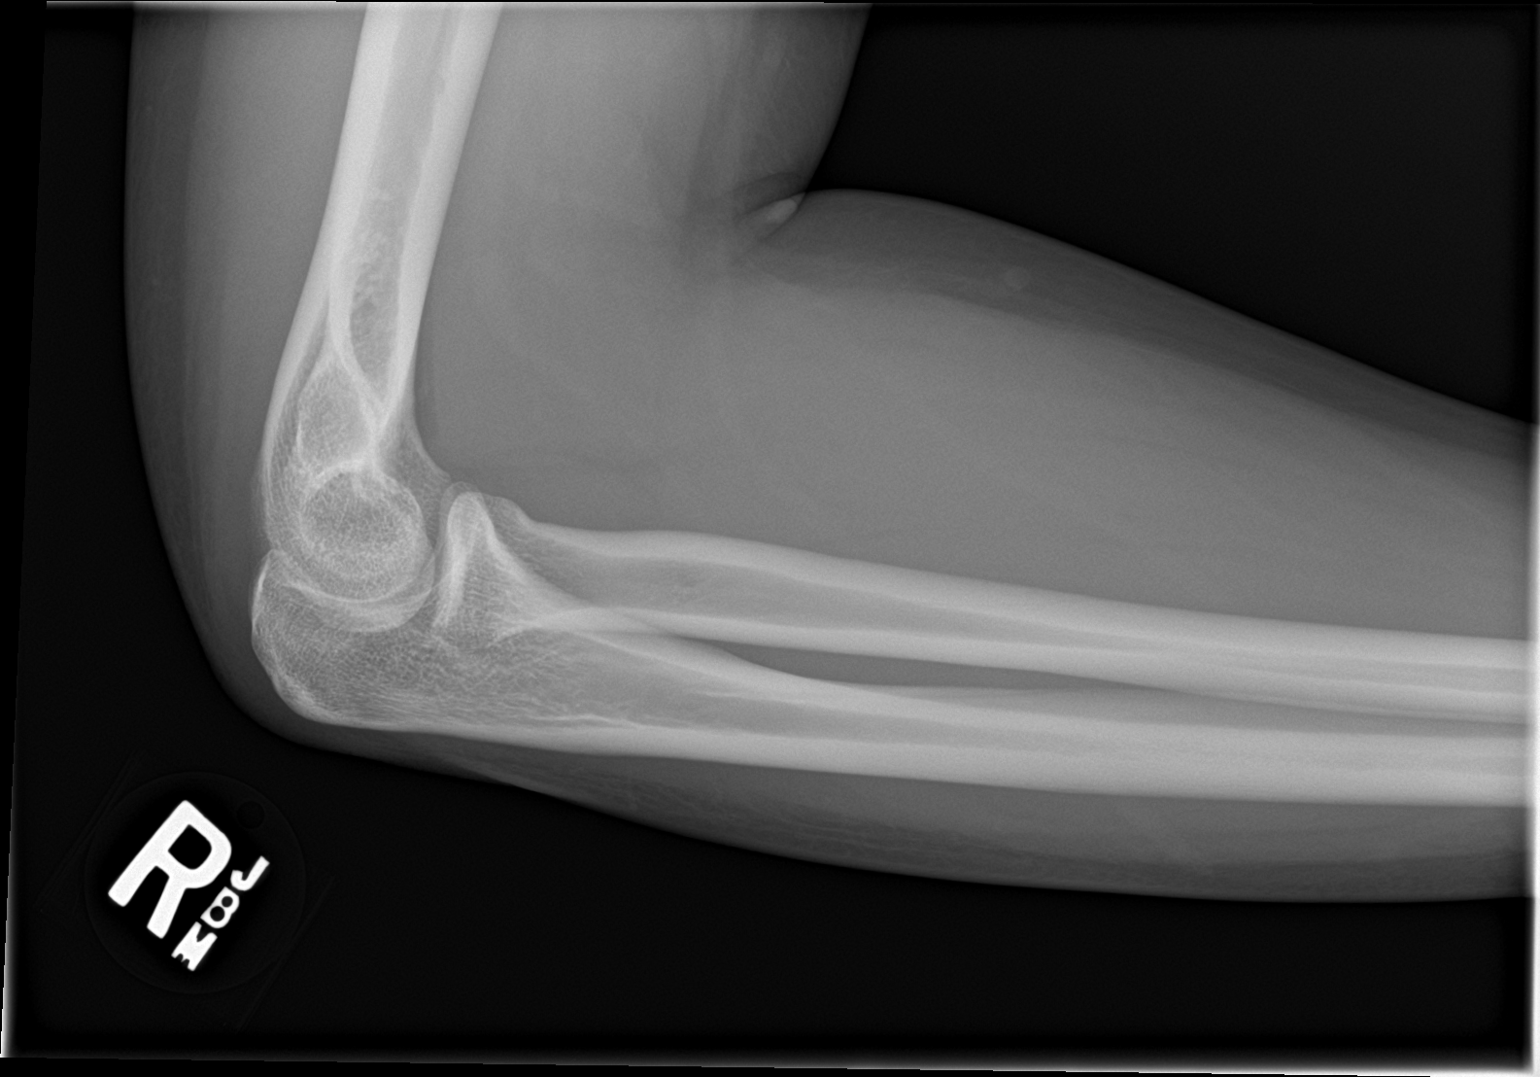
[im 2/2]
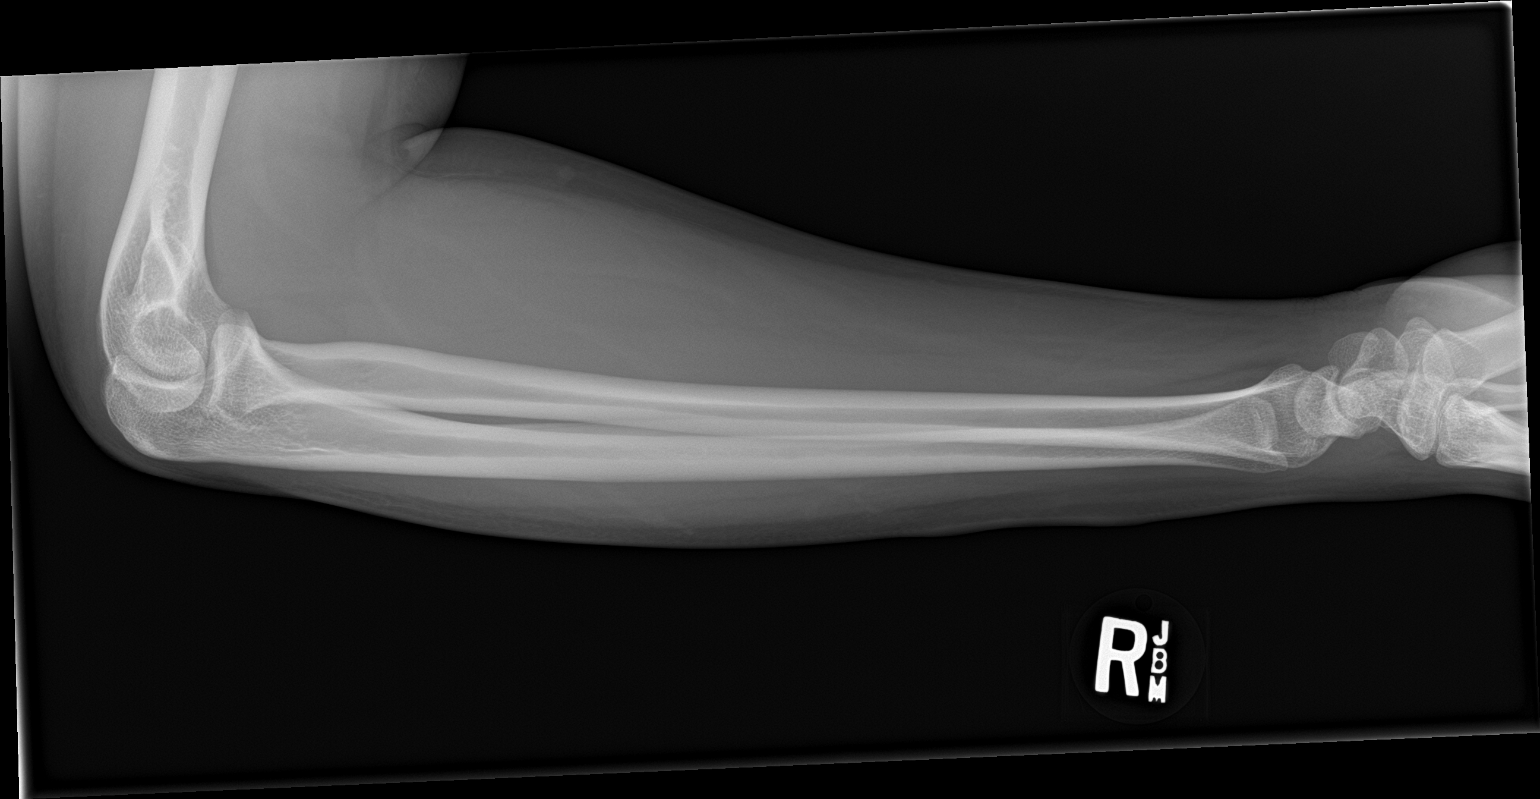

[3 of 3 positions shown; findings below may reference images not displayed]

FINDINGS: There is no evidence of fracture or other focal bone lesions. Soft
tissues are unremarkable.
IMPRESSION: Negative.
